# Patient Record
Sex: Female | Born: 1950 | Race: White | Hispanic: No | Marital: Married | Smoking: Never smoker
Health system: Southern US, Community
[De-identification: ages and names within clinical notes are randomized; demographics above are authoritative.]

## PROBLEM LIST (undated history)

## (undated) DIAGNOSIS — M810 Age-related osteoporosis without current pathological fracture: Secondary | ICD-10-CM

## (undated) DIAGNOSIS — F32A Depression, unspecified: Secondary | ICD-10-CM

## (undated) HISTORY — PX: MOHS SURGERY: SUR867

## (undated) HISTORY — DX: Age-related osteoporosis without current pathological fracture: M81.0

## (undated) HISTORY — PX: TUBAL LIGATION: SHX77

## (undated) HISTORY — PX: CERVICAL DISCECTOMY: SHX98

---

## 1999-08-20 ENCOUNTER — Encounter: Payer: Self-pay | Admitting: Family Medicine

## 1999-08-20 ENCOUNTER — Ambulatory Visit (HOSPITAL_COMMUNITY): Admission: RE | Admit: 1999-08-20 | Discharge: 1999-08-20 | Payer: Self-pay | Admitting: Family Medicine

## 2000-02-19 ENCOUNTER — Other Ambulatory Visit: Admission: RE | Admit: 2000-02-19 | Discharge: 2000-02-19 | Payer: Self-pay | Admitting: Obstetrics & Gynecology

## 2001-07-12 ENCOUNTER — Ambulatory Visit (HOSPITAL_COMMUNITY): Admission: RE | Admit: 2001-07-12 | Discharge: 2001-07-12 | Payer: Self-pay | Admitting: Family Medicine

## 2001-07-12 ENCOUNTER — Encounter: Payer: Self-pay | Admitting: Family Medicine

## 2001-08-02 DIAGNOSIS — M4322 Fusion of spine, cervical region: Secondary | ICD-10-CM

## 2001-08-02 HISTORY — DX: Fusion of spine, cervical region: M43.22

## 2001-12-18 ENCOUNTER — Ambulatory Visit (HOSPITAL_COMMUNITY): Admission: RE | Admit: 2001-12-18 | Discharge: 2001-12-18 | Payer: Self-pay | Admitting: Family Medicine

## 2001-12-18 ENCOUNTER — Other Ambulatory Visit: Admission: RE | Admit: 2001-12-18 | Discharge: 2001-12-18 | Payer: Self-pay | Admitting: Obstetrics & Gynecology

## 2001-12-18 ENCOUNTER — Encounter: Payer: Self-pay | Admitting: Family Medicine

## 2002-04-09 ENCOUNTER — Encounter: Payer: Self-pay | Admitting: Gastroenterology

## 2002-04-09 ENCOUNTER — Ambulatory Visit (HOSPITAL_COMMUNITY): Admission: RE | Admit: 2002-04-09 | Discharge: 2002-04-09 | Payer: Self-pay | Admitting: Gastroenterology

## 2002-04-13 ENCOUNTER — Ambulatory Visit (HOSPITAL_COMMUNITY): Admission: RE | Admit: 2002-04-13 | Discharge: 2002-04-13 | Payer: Self-pay | Admitting: Gastroenterology

## 2002-04-13 ENCOUNTER — Encounter: Payer: Self-pay | Admitting: Gastroenterology

## 2002-06-20 ENCOUNTER — Observation Stay (HOSPITAL_COMMUNITY): Admission: RE | Admit: 2002-06-20 | Discharge: 2002-06-21 | Payer: Self-pay | Admitting: Neurosurgery

## 2002-10-02 ENCOUNTER — Encounter: Admission: RE | Admit: 2002-10-02 | Discharge: 2002-12-31 | Payer: Self-pay | Admitting: Neurosurgery

## 2003-07-08 ENCOUNTER — Other Ambulatory Visit: Admission: RE | Admit: 2003-07-08 | Discharge: 2003-07-08 | Payer: Self-pay | Admitting: Obstetrics and Gynecology

## 2003-07-12 ENCOUNTER — Ambulatory Visit (HOSPITAL_COMMUNITY): Admission: RE | Admit: 2003-07-12 | Discharge: 2003-07-12 | Payer: Self-pay | Admitting: Obstetrics and Gynecology

## 2005-12-16 ENCOUNTER — Encounter: Admission: RE | Admit: 2005-12-16 | Discharge: 2005-12-16 | Payer: Self-pay | Admitting: Family Medicine

## 2006-02-15 ENCOUNTER — Other Ambulatory Visit: Admission: RE | Admit: 2006-02-15 | Discharge: 2006-02-15 | Payer: Self-pay | Admitting: Family Medicine

## 2006-03-02 ENCOUNTER — Ambulatory Visit: Payer: Self-pay | Admitting: Gastroenterology

## 2006-03-24 ENCOUNTER — Ambulatory Visit: Payer: Self-pay | Admitting: Gastroenterology

## 2006-03-24 ENCOUNTER — Encounter (INDEPENDENT_AMBULATORY_CARE_PROVIDER_SITE_OTHER): Payer: Self-pay | Admitting: Gastroenterology

## 2007-04-10 ENCOUNTER — Encounter: Admission: RE | Admit: 2007-04-10 | Discharge: 2007-04-10 | Payer: Self-pay | Admitting: Family Medicine

## 2008-08-11 ENCOUNTER — Ambulatory Visit: Payer: Self-pay | Admitting: Radiology

## 2008-08-11 ENCOUNTER — Emergency Department (HOSPITAL_BASED_OUTPATIENT_CLINIC_OR_DEPARTMENT_OTHER): Admission: EM | Admit: 2008-08-11 | Discharge: 2008-08-11 | Payer: Self-pay | Admitting: Emergency Medicine

## 2009-05-07 ENCOUNTER — Encounter: Admission: RE | Admit: 2009-05-07 | Discharge: 2009-05-07 | Payer: Self-pay | Admitting: Family Medicine

## 2010-12-18 NOTE — Assessment & Plan Note (Signed)
[facility]                           GASTROENTEROLOGY OFFICE NOTE   NAME:FLOYDKasidi, Shanker                        MRN:          [id number]  DATE:03/02/2006                            DOB:          [birth year]    REFERRING PHYSICIAN:  Holley Bouche, M.D.   PROBLEM:  Acid reflux.   HISTORY:  Kaliegh is a pleasant 60 year old white female known to Dr. Terrial Rhodes, who was last seen in 2003.  She had undergone upper endoscopy at  that time for complaints of dysphagia and dyspepsia, was found to have a  small hiatal hernia and evidence for spasm but no definite stricture.  She  was dilated with 54 Maloney and also found to have duodenitis.  She was  treated with PPI and did well.  The patient says she has not been on any  medicine over the past couple of years but then about 3 months ago started  having daily symptoms again, especially in the evening after eating dinner.  Since then, she has had persistent problems with daily heartburn and  indigestion and complains of a full feeling in her throat.  She is not  having any odynophagia but does have some dysphagia, especially with meats  and says she has been avoiding certain foods. She does get a sensation of  food hanging in her esophagus at times.  Her appetite has been fine.  Her  weight has been stable.  She does complain of some discomfort fairly  constantly in the epigastrium as well which does not necessarily seem to be  affected by eating.   The patient was seen by Dr. Tiburcio Pea about 2 weeks ago and started on Aciphex  which she has been taking once a day but says this really has not been very  effective.  Prior to that, she was taking over-the-counter Prilosec without  any benefit. She denies any aspirin or NSAID use.   The patient has not had any prior screening colonoscopy and has no family  history of colon cancer, has no current symptomatology, says her bowel  movements have been fairly  regular. She does have some problems with gas and  bloating, has not noted any melena or hematochezia.   CURRENT MEDICATIONS:  1.  Aciphex 20 mg q.a.m.  2.  Zoloft 100 b.i.d.   ALLERGIES:  No known drug allergies.   PAST MEDICAL HISTORY:  Pertinent for a tubal ligation and anterior cervical  fusion which was done in 2005.   FAMILY HISTORY:  Both parents with lung cancer.  No family history of colon  cancer.   SOCIAL HISTORY:  The patient is married.  She is employed as an Airline pilot.  She has 3 children.  She is a nonsmoker, nondrinker.   REVIEW OF SYSTEMS:  Pertinent only for night sweats.   PHYSICAL EXAMINATION:  GENERAL: Well-developed white female in no acute  distress.  VITAL SIGNS: Height is 5 feet 4 inches.  Weight is 116.  Blood pressure  92/60, pulse 68.  HEENT:  Atraumatic and normocephalic.  EOMI.  PERRLA.  Sclerae anicteric.  CARDIOVASCULAR:  Regular rate and rhythm with S1 and S2.  PULMONARY: Clear to auscultation and percussion.  ABDOMEN: Soft and nontender.  There is no palpable mass or  hepatosplenomegaly.  Bowel sounds are active.  RECTAL:  Exam is not done at this time.   IMPRESSION:  90.  A 60 year old white female with 2 to 45-month history of daily heartburn      and indigestion with intermittent solid food dysphagia.  I suspect she      does have recurrent esophagitis, possibly gastritis as well.  Dysphagia      may be due to spasm, though she could have developed a stricture.  She      did not have a stricture at the time of her empiric dilation in 2003.  2.  Colon neoplasia screening.  The patient needs colonoscopy.   PLAN:  1.  Schedule EGD with dilation.  2.  Schedule colonoscopy.  3.  Trial of Nexium 40 mg p.o. b.i.d. to be taken before breakfast and      before dinner.  4.  Have discussed a strict antireflux regimen including n.p.o. for 3 hours      prior to bed, elevation of the head of the bed, etc.  5.  Carafate slurry 1 g 4 times a day  between meals and then at bedtime x2      weeks and then p.r.n. thereafter.                                   Mike Gip, PA-C                                Ulyess Mort, MD   AE/MedQ  DD:  03/02/2006  DT:  03/02/2006  Job #:  [id number]   cc:   Holley Bouche, MD

## 2010-12-18 NOTE — Op Note (Signed)
Rebecca Mcfarland, Rebecca Mcfarland                           ACCOUNT NO.:  [id number]   MEDICAL RECORD NO.:  [id number]                   PATIENT TYPE:  INP   LOCATION:  3012                                 FACILITY:  [facility]   PHYSICIAN:  Cristi Loron, M.D.            DATE OF BIRTH:  1951/04/19   DATE OF PROCEDURE:  06/20/2002  DATE OF DISCHARGE:                                 OPERATIVE REPORT   INDICATIONS FOR PROCEDURE:  The patient is a 60 year old white female who  has suffered from neck and left shoulder arm pain for years.  She failed  medial management and was worked up with a cervical MRI which demonstrated  C5-6 degenerative disease, spondylosis, and stenosis.  I discussed the  various treatment options with the patient and her husband including  surgery.  They weighed the risks, benefits, and alternatives of surgery and  decided to proceed with a C5-6 anterior cervical diskectomy with fusion and  plating.   PREOPERATIVE DIAGNOSIS:  C5-6 spondylosis, stenosis, degenerative disk  disease, cervical radiculopathy, cervicalgia.   POSTOPERATIVE DIAGNOSIS:  C5-6 spondylosis, stenosis, degenerative disk  disease, cervical radiculopathy, cervicalgia.   PROCEDURE:  C5-6 extensive anterior cervical diskectomy/decompression;  interbody iliac crest allograft arthrodesis, anterior cervical plating  (Synthes titanium plate and screws).   SURGEON:  Cristi Loron, M.D.   ASSISTANT:  Coletta Memos, M.D.   ANESTHESIA:  General endotracheal.   ESTIMATED BLOOD LOSS:  100 cc.   SPECIMENS:  None.   DRAINS:  None.   COMPLICATIONS:  None.   DESCRIPTION OF PROCEDURE:  The patient was brought to the operating room by  the anesthesia team.  General endotracheal was induced.  The patient  remained in the supine position. A roll was placed under her shoulders to  place her neck in slight extension.  The anterior cervical region was then  prepared with Betadine scrub and Betadine solution.  Sterile drapes were  applied.  I then injected the area to be incised with Marcaine with  epinephrine solution.  I used the scalpel to make a linear midline incision  in the patient's left anterior neck.  The Metzenbaum scissors were used to  divide the platysma muscle and then to dissect medial to the  sternocleidomastoid muscle, jugular vein and carotid artery. We bluntly  dissected down toward the anterior cervical spine, carefully identifying the  sac and using retractors medially.  Soft tissue was then removed from the  anterior cervical spine using Kitner swabs and a bent spinal needle was  inserted into the exposed interspace.  We obtained an intraoperative  radiograph to confirm our location.  Electrocautery was used to detach the  medial border of the longus coli muscle bilaterally at the C5-6  intervertebral disk space and then the Caspar self retaining retractor was  inserted for exposure.  We then incised the C5-6 intervertebral disk with a  15 blade scalpel and  a partial diskectomy was performed using the pituitary  forceps and the caudal curets.  The screws were placed into C5 and C6 and  the interspace was distracted and then the highspeed drill was used to  decorticate the vertebral endplates at C5-6, drilling away the remainder of  the intervertebral disk, thinning out the posterior longitudinal ligament.  The ligament was then incised with the ratchet knife and then removed with  the Kerrison punch on the vertebral endplates at C5-6 due to excess thecal  sac and then performed generous foraminotomies about the bilateral C6 nerve  roots getting good decompression bilaterally.   We now turned our attention to the fusion.  We obtained iliac crest cortical  allograft bone graft and fashioned it with the distraction wrenches 7 mm in  height and 1 cm in depth. The bone graft was then gently inserted into place  at the C5-6 interspace and tapped into place and then the  distractor screws  were removed and there was a good snug fit of the bone graft.   We now turned our attention to the anterior spinal instrumentation. We  obtained the appropriate length Synthes anterior cervical plate and laid it  along the anterior aspect of the vertebral bodies of C5 and C6, drilled two  holes at C5, through C6, tapped the holes and then secured the plate to the  vertebral bodies with two 14 mm screws at C5 and two at C6.  We then  obtained an intraoperative radiograph which demonstrated good purchase of  screws and then the screws were locked to the plate using the locking screws  at each screw. We then achieved stringent hemostasis with bipolar  electrocautery and Gelfoam.  The wound was copiously irrigated with  bacitracin solution. The solution was removed as was the Arpelar self  retaining retractor and then the esophagus was inspected for any damage and  there was none apparent.  We then reapproximated the patient's platysmal  muscle with interrupted 3-0 Vicryl suture and subcutaneous tissues with  interrupted 3-0 Vicryl suture and the skin with Steri-Strips and Benzoin.  The wound was then coated with bacitracin ointment and sterile dressing was  applied. The drapes were removed. The patient was subsequently extubated by  the anesthesia team and transported to the postanesthesia care unit in  stable condition.  All needle, sponge, and instrument count correct at the  end of the case.                                               Cristi Loron, M.D.    JDJ/MEDQ  D:  06/20/2002  T:  06/20/2002  Job:  [id number]

## 2011-05-31 ENCOUNTER — Other Ambulatory Visit: Payer: Self-pay | Admitting: Family Medicine

## 2011-05-31 ENCOUNTER — Other Ambulatory Visit (HOSPITAL_COMMUNITY)
Admission: RE | Admit: 2011-05-31 | Discharge: 2011-05-31 | Disposition: A | Discharge status: Home / Self Care | Payer: BC Managed Care – PPO | Source: Ambulatory Visit | Attending: Family Medicine | Admitting: Family Medicine

## 2011-05-31 DIAGNOSIS — Z0000 Encounter for general adult medical examination without abnormal findings: Secondary | ICD-10-CM | POA: Insufficient documentation

## 2011-05-31 DIAGNOSIS — Z1231 Encounter for screening mammogram for malignant neoplasm of breast: Secondary | ICD-10-CM

## 2011-06-04 ENCOUNTER — Ambulatory Visit
Admission: RE | Admit: 2011-06-04 | Discharge: 2011-06-04 | Disposition: A | Discharge status: Home / Self Care | Payer: BC Managed Care – PPO | Source: Ambulatory Visit | Attending: Family Medicine | Admitting: Family Medicine

## 2011-06-04 DIAGNOSIS — Z1231 Encounter for screening mammogram for malignant neoplasm of breast: Secondary | ICD-10-CM

## 2012-08-03 ENCOUNTER — Other Ambulatory Visit: Payer: Self-pay | Admitting: Family Medicine

## 2012-08-03 DIAGNOSIS — Z1231 Encounter for screening mammogram for malignant neoplasm of breast: Secondary | ICD-10-CM

## 2012-08-25 ENCOUNTER — Ambulatory Visit
Admission: RE | Admit: 2012-08-25 | Discharge: 2012-08-25 | Disposition: A | Discharge status: Home / Self Care | Payer: BC Managed Care – PPO | Source: Ambulatory Visit | Attending: Family Medicine | Admitting: Family Medicine

## 2012-08-25 DIAGNOSIS — Z1231 Encounter for screening mammogram for malignant neoplasm of breast: Secondary | ICD-10-CM

## 2013-10-19 ENCOUNTER — Other Ambulatory Visit: Payer: Self-pay

## 2013-10-19 DIAGNOSIS — Z1231 Encounter for screening mammogram for malignant neoplasm of breast: Secondary | ICD-10-CM

## 2014-06-07 ENCOUNTER — Ambulatory Visit
Admission: RE | Admit: 2014-06-07 | Discharge: 2014-06-07 | Disposition: A | Discharge status: Home / Self Care | Payer: BC Managed Care – PPO | Source: Ambulatory Visit

## 2014-06-07 DIAGNOSIS — Z1231 Encounter for screening mammogram for malignant neoplasm of breast: Secondary | ICD-10-CM

## 2014-08-15 ENCOUNTER — Other Ambulatory Visit (HOSPITAL_COMMUNITY)
Admission: RE | Admit: 2014-08-15 | Discharge: 2014-08-15 | Disposition: A | Discharge status: Home / Self Care | Payer: BLUE CROSS/BLUE SHIELD | Source: Ambulatory Visit | Attending: Family Medicine | Admitting: Family Medicine

## 2014-08-15 ENCOUNTER — Other Ambulatory Visit: Payer: Self-pay | Admitting: Family Medicine

## 2014-08-15 DIAGNOSIS — Z01419 Encounter for gynecological examination (general) (routine) without abnormal findings: Secondary | ICD-10-CM | POA: Diagnosis present

## 2014-08-16 LAB — CYTOLOGY - PAP

## 2015-07-22 ENCOUNTER — Other Ambulatory Visit: Payer: Self-pay

## 2015-07-22 DIAGNOSIS — Z1231 Encounter for screening mammogram for malignant neoplasm of breast: Secondary | ICD-10-CM

## 2015-08-20 ENCOUNTER — Ambulatory Visit
Admission: RE | Admit: 2015-08-20 | Discharge: 2015-08-20 | Disposition: A | Discharge status: Home / Self Care | Payer: BLUE CROSS/BLUE SHIELD | Source: Ambulatory Visit

## 2015-08-20 DIAGNOSIS — Z1231 Encounter for screening mammogram for malignant neoplasm of breast: Secondary | ICD-10-CM

## 2016-09-28 ENCOUNTER — Encounter: Payer: Self-pay | Admitting: Family Medicine

## 2016-10-18 ENCOUNTER — Other Ambulatory Visit: Payer: Self-pay | Admitting: Family Medicine

## 2016-10-18 DIAGNOSIS — Z1231 Encounter for screening mammogram for malignant neoplasm of breast: Secondary | ICD-10-CM

## 2016-11-03 ENCOUNTER — Ambulatory Visit
Admission: RE | Admit: 2016-11-03 | Discharge: 2016-11-03 | Disposition: A | Discharge status: Home / Self Care | Payer: 59 | Source: Ambulatory Visit | Attending: Family Medicine | Admitting: Family Medicine

## 2016-11-03 DIAGNOSIS — Z1231 Encounter for screening mammogram for malignant neoplasm of breast: Secondary | ICD-10-CM

## 2017-09-01 ENCOUNTER — Other Ambulatory Visit (HOSPITAL_COMMUNITY)
Admission: RE | Admit: 2017-09-01 | Discharge: 2017-09-01 | Disposition: A | Discharge status: Home / Self Care | Payer: 59 | Source: Ambulatory Visit | Attending: Family Medicine | Admitting: Family Medicine

## 2017-09-01 ENCOUNTER — Other Ambulatory Visit: Payer: Self-pay | Admitting: Family Medicine

## 2017-09-01 DIAGNOSIS — Z01419 Encounter for gynecological examination (general) (routine) without abnormal findings: Secondary | ICD-10-CM | POA: Insufficient documentation

## 2017-09-02 LAB — CYTOLOGY - PAP: Diagnosis: NEGATIVE

## 2017-12-13 ENCOUNTER — Other Ambulatory Visit: Payer: Self-pay | Admitting: Family Medicine

## 2017-12-13 DIAGNOSIS — Z1231 Encounter for screening mammogram for malignant neoplasm of breast: Secondary | ICD-10-CM

## 2017-12-16 ENCOUNTER — Ambulatory Visit
Admission: RE | Admit: 2017-12-16 | Discharge: 2017-12-16 | Disposition: A | Discharge status: Home / Self Care | Payer: 59 | Source: Ambulatory Visit | Attending: Family Medicine | Admitting: Family Medicine

## 2017-12-16 DIAGNOSIS — Z1231 Encounter for screening mammogram for malignant neoplasm of breast: Secondary | ICD-10-CM

## 2018-09-06 ENCOUNTER — Other Ambulatory Visit: Payer: Self-pay | Admitting: Family Medicine

## 2018-09-06 DIAGNOSIS — M810 Age-related osteoporosis without current pathological fracture: Secondary | ICD-10-CM

## 2018-12-12 ENCOUNTER — Other Ambulatory Visit: Payer: Self-pay | Admitting: Family Medicine

## 2018-12-12 DIAGNOSIS — Z1231 Encounter for screening mammogram for malignant neoplasm of breast: Secondary | ICD-10-CM

## 2019-03-02 ENCOUNTER — Ambulatory Visit
Admission: RE | Admit: 2019-03-02 | Discharge: 2019-03-02 | Disposition: A | Discharge status: Home / Self Care | Payer: 59 | Source: Ambulatory Visit | Attending: Family Medicine | Admitting: Family Medicine

## 2019-03-02 ENCOUNTER — Other Ambulatory Visit: Payer: Self-pay

## 2019-03-02 DIAGNOSIS — M810 Age-related osteoporosis without current pathological fracture: Secondary | ICD-10-CM

## 2019-03-02 DIAGNOSIS — Z1231 Encounter for screening mammogram for malignant neoplasm of breast: Secondary | ICD-10-CM

## 2019-08-17 ENCOUNTER — Ambulatory Visit: Payer: 59 | Attending: Internal Medicine

## 2019-08-17 DIAGNOSIS — Z20822 Contact with and (suspected) exposure to covid-19: Secondary | ICD-10-CM

## 2019-08-18 LAB — NOVEL CORONAVIRUS, NAA: SARS-CoV-2, NAA: NOT DETECTED

## 2020-04-02 ENCOUNTER — Other Ambulatory Visit: Payer: Self-pay | Admitting: Family Medicine

## 2020-04-02 DIAGNOSIS — Z1231 Encounter for screening mammogram for malignant neoplasm of breast: Secondary | ICD-10-CM

## 2020-04-08 ENCOUNTER — Other Ambulatory Visit: Payer: Self-pay

## 2020-04-08 ENCOUNTER — Ambulatory Visit
Admission: RE | Admit: 2020-04-08 | Discharge: 2020-04-08 | Disposition: A | Discharge status: Home / Self Care | Payer: Medicare Other | Source: Ambulatory Visit

## 2020-04-08 DIAGNOSIS — Z1231 Encounter for screening mammogram for malignant neoplasm of breast: Secondary | ICD-10-CM

## 2020-04-11 ENCOUNTER — Other Ambulatory Visit: Payer: Self-pay | Admitting: Family Medicine

## 2020-04-11 DIAGNOSIS — R928 Other abnormal and inconclusive findings on diagnostic imaging of breast: Secondary | ICD-10-CM

## 2020-04-28 ENCOUNTER — Ambulatory Visit: Payer: Medicare Other

## 2020-04-28 ENCOUNTER — Ambulatory Visit
Admission: RE | Admit: 2020-04-28 | Discharge: 2020-04-28 | Disposition: A | Discharge status: Home / Self Care | Payer: Medicare Other | Source: Ambulatory Visit | Attending: Family Medicine | Admitting: Family Medicine

## 2020-04-28 ENCOUNTER — Other Ambulatory Visit: Payer: Self-pay

## 2020-04-28 DIAGNOSIS — R928 Other abnormal and inconclusive findings on diagnostic imaging of breast: Secondary | ICD-10-CM

## 2020-08-12 DIAGNOSIS — M25562 Pain in left knee: Secondary | ICD-10-CM | POA: Diagnosis not present

## 2020-08-21 DIAGNOSIS — M25562 Pain in left knee: Secondary | ICD-10-CM | POA: Diagnosis not present

## 2020-08-23 DIAGNOSIS — M25562 Pain in left knee: Secondary | ICD-10-CM | POA: Diagnosis not present

## 2020-08-28 DIAGNOSIS — M25562 Pain in left knee: Secondary | ICD-10-CM | POA: Diagnosis not present

## 2020-09-16 DIAGNOSIS — E7800 Pure hypercholesterolemia, unspecified: Secondary | ICD-10-CM | POA: Diagnosis not present

## 2020-09-16 DIAGNOSIS — Z0000 Encounter for general adult medical examination without abnormal findings: Secondary | ICD-10-CM | POA: Diagnosis not present

## 2020-09-22 ENCOUNTER — Other Ambulatory Visit (HOSPITAL_COMMUNITY)
Admission: RE | Admit: 2020-09-22 | Discharge: 2020-09-22 | Disposition: A | Discharge status: Home / Self Care | Payer: Medicare Other | Source: Ambulatory Visit | Attending: Orthopaedic Surgery | Admitting: Orthopaedic Surgery

## 2020-09-22 ENCOUNTER — Encounter (HOSPITAL_BASED_OUTPATIENT_CLINIC_OR_DEPARTMENT_OTHER): Payer: Self-pay | Admitting: Orthopaedic Surgery

## 2020-09-22 ENCOUNTER — Other Ambulatory Visit: Payer: Self-pay

## 2020-09-22 DIAGNOSIS — Z20822 Contact with and (suspected) exposure to covid-19: Secondary | ICD-10-CM | POA: Insufficient documentation

## 2020-09-22 DIAGNOSIS — Z01812 Encounter for preprocedural laboratory examination: Secondary | ICD-10-CM | POA: Diagnosis not present

## 2020-09-22 LAB — SARS CORONAVIRUS 2 (TAT 6-24 HRS): SARS Coronavirus 2: NEGATIVE

## 2020-09-22 NOTE — Progress Notes (Signed)
I left a voicemail message with Sherri at [facility] office making her aware that this pt has been called and left multiple voice messages on the pts listed phone number and her husbands cell phone number with no success.

## 2020-09-23 NOTE — H&P (Signed)
PREOPERATIVE H&P  Chief Complaint: LEFT KNEE MEDICAL MENISCUS TEAR  HPI: Rebecca Mcfarland is a 70 y.o. female who is scheduled for, Procedure(s): KNEE ARTHROSCOPY WITH MEDIAL MENISECTOMY KNEE ARTHROSCOPY WITH MEDIAL MENISCAL REPAIR.   The patient is a healthy 70 year old who was seen in urgent care for medial based knee pain in an active patient who appears much younger than her stated age.  She has done some ice and self-directed therapy exercises and has made improvement, but she is still not able to run.  She has pain and catching in the medial aspect of her knee.    Her symptoms are rated as moderate to severe, and have been worsening.  This is significantly impairing activities of daily living.    Please see clinic note for further details on this patient's care.    She has elected for surgical management.   Past Medical History:  Diagnosis Date  . Cervical vertebral fusion 2003  . Depression    Past Surgical History:  Procedure Laterality Date  . MOHS SURGERY  2021,2011   Social History   Socioeconomic History  . Marital status: Married    Spouse name: Not on file  . Number of children: Not on file  . Years of education: Not on file  . Highest education level: Not on file  Occupational History  . Not on file  Tobacco Use  . Smoking status: Never Smoker  . Smokeless tobacco: Never Used  Substance and Sexual Activity  . Alcohol use: Not on file    Comment: occas  . Drug use: Not on file  . Sexual activity: Not on file  Other Topics Concern  . Not on file  Social History Narrative  . Not on file   Social Determinants of Health   Financial Resource Strain: Not on file  Food Insecurity: Not on file  Transportation Needs: Not on file  Physical Activity: Not on file  Stress: Not on file  Social Connections: Not on file   Family History  Problem Relation Age of Onset  . Breast cancer Neg Hx    No Known Allergies Prior to Admission medications    Medication Sig Start Date End Date Taking? Authorizing Provider  buPROPion (WELLBUTRIN XL) 300 MG 24 hr tablet Take 300 mg by mouth daily.   Yes [provider]  sertraline (ZOLOFT) 100 MG tablet Take 200 mg by mouth daily.   Yes [provider]    ROS: All other systems have been reviewed and were otherwise negative with the exception of those mentioned in the HPI and as above.  Physical Exam: General: Alert, no acute distress Cardiovascular: No pedal edema Respiratory: No cyanosis, no use of accessory musculature GI: No organomegaly, abdomen is soft and non-tender Skin: No lesions in the area of chief complaint Neurologic: Sensation intact distally Psychiatric: Patient is competent for consent with normal mood and affect Lymphatic: No axillary or cervical lymphadenopathy  MUSCULOSKELETAL:  Left knee: Positive McMurray's.  Tender to palpation at the medial joint line.  Ligamentous exam is normal.  No effusion.    Imaging: MRI demonstrates complete radial tear of the posterior horn of the root of the meniscus.  She has some mild degenerative changes, but nothing significant.    Assessment: LEFT KNEE MEDICAL MENISCUS TEAR  Plan: Plan for Procedure(s): KNEE ARTHROSCOPY WITH MEDIAL MENISECTOMY KNEE ARTHROSCOPY WITH MEDIAL MENISCAL REPAIR  The risks benefits and alternatives were discussed with the patient including but not limited to the  risks of nonoperative treatment, versus surgical intervention including infection, bleeding, nerve injury,  blood clots, cardiopulmonary complications, morbidity, mortality, among others, and they were willing to proceed.   The patient acknowledged the explanation, agreed to proceed with the plan and consent was signed.   Operative Plan: Left knee scope with meniscal root repair versus partial meniscectomy Discharge Medications: Tylenol, Celebrex, Oxycodone, Zofran DVT Prophylaxis: Aspirin Physical Therapy: +/- Special  Discharge needs: +/-   Ethelda Chick, PA-C  09/23/2020 6:57 AM

## 2020-09-25 ENCOUNTER — Other Ambulatory Visit: Payer: Self-pay

## 2020-09-25 ENCOUNTER — Encounter (HOSPITAL_BASED_OUTPATIENT_CLINIC_OR_DEPARTMENT_OTHER): Payer: Self-pay | Admitting: Orthopaedic Surgery

## 2020-09-25 ENCOUNTER — Ambulatory Visit (HOSPITAL_BASED_OUTPATIENT_CLINIC_OR_DEPARTMENT_OTHER): Payer: Medicare Other | Admitting: Anesthesiology

## 2020-09-25 ENCOUNTER — Ambulatory Visit (HOSPITAL_BASED_OUTPATIENT_CLINIC_OR_DEPARTMENT_OTHER)
Admission: RE | Admit: 2020-09-25 | Discharge: 2020-09-25 | Disposition: A | Discharge status: Home / Self Care | Payer: Medicare Other | Attending: Orthopaedic Surgery | Admitting: Orthopaedic Surgery

## 2020-09-25 ENCOUNTER — Encounter (HOSPITAL_BASED_OUTPATIENT_CLINIC_OR_DEPARTMENT_OTHER)
Admission: RE | Disposition: A | Discharge status: Home / Self Care | Payer: Self-pay | Source: Home / Self Care | Attending: Orthopaedic Surgery

## 2020-09-25 DIAGNOSIS — X58XXXA Exposure to other specified factors, initial encounter: Secondary | ICD-10-CM | POA: Diagnosis not present

## 2020-09-25 DIAGNOSIS — S83242A Other tear of medial meniscus, current injury, left knee, initial encounter: Secondary | ICD-10-CM | POA: Insufficient documentation

## 2020-09-25 DIAGNOSIS — Z79899 Other long term (current) drug therapy: Secondary | ICD-10-CM | POA: Diagnosis not present

## 2020-09-25 DIAGNOSIS — Z981 Arthrodesis status: Secondary | ICD-10-CM | POA: Diagnosis not present

## 2020-09-25 DIAGNOSIS — Y939 Activity, unspecified: Secondary | ICD-10-CM | POA: Insufficient documentation

## 2020-09-25 DIAGNOSIS — Q754 Mandibulofacial dysostosis: Secondary | ICD-10-CM | POA: Diagnosis present

## 2020-09-25 DIAGNOSIS — F32A Depression, unspecified: Secondary | ICD-10-CM | POA: Diagnosis not present

## 2020-09-25 HISTORY — PX: KNEE ARTHROSCOPY WITH MENISCAL REPAIR: SHX5653

## 2020-09-25 HISTORY — DX: Depression, unspecified: F32.A

## 2020-09-25 SURGERY — ARTHROSCOPY, KNEE, WITH MENISCUS REPAIR
Anesthesia: General | Site: Knee | Laterality: Left

## 2020-09-25 MED ORDER — FENTANYL CITRATE (PF) 100 MCG/2ML IJ SOLN
INTRAMUSCULAR | Status: DC | PRN
  Administered 2020-09-25 (×2): 25 ug via INTRAVENOUS
  Administered 2020-09-25: 50 ug via INTRAVENOUS

## 2020-09-25 MED ORDER — FENTANYL CITRATE (PF) 100 MCG/2ML IJ SOLN
INTRAMUSCULAR | Status: AC
  Filled 2020-09-25: qty 2

## 2020-09-25 MED ORDER — MIDAZOLAM HCL 2 MG/2ML IJ SOLN
INTRAMUSCULAR | Status: AC
  Filled 2020-09-25: qty 2

## 2020-09-25 MED ORDER — EPHEDRINE SULFATE 50 MG/ML IJ SOLN
INTRAMUSCULAR | Status: DC | PRN
  Administered 2020-09-25: 10 mg via INTRAVENOUS

## 2020-09-25 MED ORDER — LACTATED RINGERS IV SOLN: INTRAVENOUS | Status: DC

## 2020-09-25 MED ORDER — ONDANSETRON HCL 4 MG/2ML IJ SOLN
INTRAMUSCULAR | Status: AC
  Filled 2020-09-25: qty 2

## 2020-09-25 MED ORDER — PROPOFOL 10 MG/ML IV BOLUS
INTRAVENOUS | Status: AC
  Filled 2020-09-25: qty 20

## 2020-09-25 MED ORDER — OXYCODONE HCL 5 MG PO TABS: ORAL_TABLET | ORAL | 0 refills | Status: AC

## 2020-09-25 MED ORDER — OMEPRAZOLE 20 MG PO CPDR: 20.0000 mg | DELAYED_RELEASE_CAPSULE | Freq: Every day | ORAL | 0 refills | Status: DC

## 2020-09-25 MED ORDER — MIDAZOLAM HCL 5 MG/5ML IJ SOLN
INTRAMUSCULAR | Status: DC | PRN
  Administered 2020-09-25: 2 mg via INTRAVENOUS

## 2020-09-25 MED ORDER — CEFAZOLIN SODIUM-DEXTROSE 2-4 GM/100ML-% IV SOLN
INTRAVENOUS | Status: AC
  Filled 2020-09-25: qty 100

## 2020-09-25 MED ORDER — DEXAMETHASONE SODIUM PHOSPHATE 4 MG/ML IJ SOLN
INTRAMUSCULAR | Status: DC | PRN
  Administered 2020-09-25: 5 mg via INTRAVENOUS

## 2020-09-25 MED ORDER — FENTANYL CITRATE (PF) 100 MCG/2ML IJ SOLN
25.0000 ug | INTRAMUSCULAR | Status: DC | PRN
Start: 2020-09-25 — End: 2020-09-25
  Administered 2020-09-25: 25 ug via INTRAVENOUS
  Administered 2020-09-25 (×2): 50 ug via INTRAVENOUS
  Administered 2020-09-25: 25 ug via INTRAVENOUS

## 2020-09-25 MED ORDER — BUPIVACAINE HCL (PF) 0.25 % IJ SOLN
INTRAMUSCULAR | Status: DC | PRN
  Administered 2020-09-25: 20 mL

## 2020-09-25 MED ORDER — ACETAMINOPHEN 500 MG PO TABS: 1000.0000 mg | ORAL_TABLET | Freq: Three times a day (TID) | ORAL | 0 refills | Status: AC

## 2020-09-25 MED ORDER — ONDANSETRON HCL 4 MG/2ML IJ SOLN
INTRAMUSCULAR | Status: DC | PRN
  Administered 2020-09-25: 4 mg via INTRAVENOUS

## 2020-09-25 MED ORDER — LIDOCAINE 2% (20 MG/ML) 5 ML SYRINGE
INTRAMUSCULAR | Status: AC
  Filled 2020-09-25: qty 5

## 2020-09-25 MED ORDER — DEXAMETHASONE SODIUM PHOSPHATE 10 MG/ML IJ SOLN
INTRAMUSCULAR | Status: AC
  Filled 2020-09-25: qty 1

## 2020-09-25 MED ORDER — CELECOXIB 100 MG PO CAPS: 100.0000 mg | ORAL_CAPSULE | Freq: Two times a day (BID) | ORAL | 0 refills | Status: AC

## 2020-09-25 MED ORDER — ASPIRIN 81 MG PO CHEW: 81.0000 mg | CHEWABLE_TABLET | Freq: Two times a day (BID) | ORAL | 0 refills | Status: AC

## 2020-09-25 MED ORDER — EPHEDRINE 5 MG/ML INJ
INTRAVENOUS | Status: AC
  Filled 2020-09-25: qty 10

## 2020-09-25 MED ORDER — CEFAZOLIN SODIUM-DEXTROSE 2-4 GM/100ML-% IV SOLN
2.0000 g | INTRAVENOUS | Status: AC
  Administered 2020-09-25: 2 g via INTRAVENOUS

## 2020-09-25 MED ORDER — SODIUM CHLORIDE 0.9 % IR SOLN
Status: DC | PRN
  Administered 2020-09-25: 2000 mL

## 2020-09-25 MED ORDER — PROPOFOL 10 MG/ML IV BOLUS
INTRAVENOUS | Status: DC | PRN
  Administered 2020-09-25: 160 mg via INTRAVENOUS

## 2020-09-25 MED ORDER — ONDANSETRON HCL 4 MG PO TABS: 4.0000 mg | ORAL_TABLET | Freq: Three times a day (TID) | ORAL | 1 refills | Status: AC | PRN

## 2020-09-25 SURGICAL SUPPLY — 50 items
APL PRP STRL LF DISP 70% ISPRP (MISCELLANEOUS) ×1
BANDAGE ESMARK 6X9 LF (GAUZE/BANDAGES/DRESSINGS) IMPLANT
BLADE CLIPPER SURG (BLADE) IMPLANT
BLADE SHAVER BONE 5.0X13 (MISCELLANEOUS) IMPLANT
BNDG CMPR 9X6 STRL LF SNTH (GAUZE/BANDAGES/DRESSINGS)
BNDG ELASTIC 6X5.8 VLCR STR LF (GAUZE/BANDAGES/DRESSINGS) ×2 IMPLANT
BNDG ESMARK 6X9 LF (GAUZE/BANDAGES/DRESSINGS)
BURR OVAL 8 FLU 4.0X13 (MISCELLANEOUS) IMPLANT
CHLORAPREP W/TINT 26 (MISCELLANEOUS) ×2 IMPLANT
CLSR STERI-STRIP ANTIMIC 1/2X4 (GAUZE/BANDAGES/DRESSINGS) ×2 IMPLANT
CUFF TOURN SGL QUICK 34 (TOURNIQUET CUFF) ×2
CUFF TRNQT CYL 34X4.125X (TOURNIQUET CUFF) ×1 IMPLANT
CUTTER TENSIONER SUT 2-0 0 FBW (INSTRUMENTS) IMPLANT
DISSECTOR 3.5MM X 13CM CVD (MISCELLANEOUS) IMPLANT
DISSECTOR 4.0MMX13CM CVD (MISCELLANEOUS) ×2 IMPLANT
DRAPE ARTHROSCOPY W/POUCH 90 (DRAPES) ×2 IMPLANT
DRAPE IMP U-DRAPE 54X76 (DRAPES) ×2 IMPLANT
DRAPE U-SHAPE 47X51 STRL (DRAPES) ×2 IMPLANT
FIBERSTICK 2 (SUTURE) IMPLANT
GAUZE SPONGE 4X4 12PLY STRL (GAUZE/BANDAGES/DRESSINGS) ×2 IMPLANT
GLOVE SRG 8 PF TXTR STRL LF DI (GLOVE) ×1 IMPLANT
GLOVE SURG ENC MOIS LTX SZ6.5 (GLOVE) ×2 IMPLANT
GLOVE SURG LTX SZ8 (GLOVE) ×4 IMPLANT
GLOVE SURG UNDER POLY LF SZ6.5 (GLOVE) ×2 IMPLANT
GLOVE SURG UNDER POLY LF SZ8 (GLOVE) ×2
GOWN STRL REUS W/ TWL LRG LVL3 (GOWN DISPOSABLE) ×2 IMPLANT
GOWN STRL REUS W/TWL LRG LVL3 (GOWN DISPOSABLE) ×6
GOWN STRL REUS W/TWL XL LVL3 (GOWN DISPOSABLE) ×2 IMPLANT
IMMOBILIZER KNEE 22 UNIV (SOFTGOODS) ×1 IMPLANT
KIT ROOT REPAIR MEINISCAL PEEK (Anchor) IMPLANT
KIT TURNOVER KIT B (KITS) ×2 IMPLANT
MANIFOLD NEPTUNE II (INSTRUMENTS) IMPLANT
MEINISCAL ROOT REPAIR KIT PEEK (Anchor) ×2 IMPLANT
NDL SAFETY ECLIPSE 18X1.5 (NEEDLE) ×1 IMPLANT
NEEDLE HYPO 18GX1.5 SHARP (NEEDLE) ×2
NS IRRIG 1000ML POUR BTL (IV SOLUTION) IMPLANT
PACK ARTHROSCOPY DSU (CUSTOM PROCEDURE TRAY) ×2 IMPLANT
PAD COLD SHLDR UNI WRAP-ON (PAD) ×2
PAD COLD UNI WRAP-ON (PAD) IMPLANT
PADDING CAST COTTON 6X4 STRL (CAST SUPPLIES) IMPLANT
PORT APPOLLO RF 90DEGREE MULTI (SURGICAL WAND) IMPLANT
SLEEVE SCD COMPRESS KNEE MED (MISCELLANEOUS) ×2 IMPLANT
SUT MNCRL AB 4-0 PS2 18 (SUTURE) ×2 IMPLANT
SYR 5ML LL (SYRINGE) ×2 IMPLANT
SYR 5ML LUER SLIP (SYRINGE) ×2 IMPLANT
TOWEL GREEN STERILE FF (TOWEL DISPOSABLE) ×4 IMPLANT
TUBE CONNECTING 20X1/4 (TUBING) ×2 IMPLANT
TUBE SUCTION HIGH CAP CLEAR NV (SUCTIONS) ×1 IMPLANT
TUBING ARTHROSCOPY IRRIG 16FT (MISCELLANEOUS) ×2 IMPLANT
WATER STERILE IRR 1000ML POUR (IV SOLUTION) ×2 IMPLANT

## 2020-09-25 NOTE — Anesthesia Postprocedure Evaluation (Signed)
Anesthesia Post Note  Patient: Rebecca Mcfarland  Procedure(s) Performed: KNEE ARTHROSCOPY WITH MEDIAL MENISCAL ROOT REPAIR (Left Knee)     Patient location during evaluation: PACU Anesthesia Type: General Level of consciousness: awake Pain management: pain level controlled Vital Signs Assessment: post-procedure vital signs reviewed and stable Respiratory status: spontaneous breathing Cardiovascular status: stable Postop Assessment: no apparent nausea or vomiting Anesthetic complications: no   No complications documented.  Last Vitals:  Vitals:   09/25/20 1330 09/25/20 1351  BP: (!) 128/92 (!) 153/95  Pulse: 90 82  Resp: 16 16  Temp:  36.7 C  SpO2: 99% 95%    Last Pain:  Vitals:   09/25/20 1424  TempSrc:   PainSc: 7                  Travontae Freiberger

## 2020-09-25 NOTE — Anesthesia Procedure Notes (Signed)
Procedure Name: LMA Insertion Date/Time: 09/25/2020 11:55 AM Performed by: Glory Buff, CRNA Pre-anesthesia Checklist: Patient identified, Emergency Drugs available, Suction available and Patient being monitored Patient Re-evaluated:Patient Re-evaluated prior to induction Oxygen Delivery Method: Circle system utilized Preoxygenation: Pre-oxygenation with 100% oxygen Induction Type: IV induction Ventilation: Mask ventilation without difficulty LMA: LMA inserted LMA Size: 4.0 Number of attempts: 1 Placement Confirmation: positive ETCO2 Tube secured with: Tape Dental Injury: Teeth and Oropharynx as per pre-operative assessment

## 2020-09-25 NOTE — Discharge Instructions (Signed)
Ophelia Charter MD, MPH Noemi Chapel, PA-C [facility] [address] [phone] (tel)   [phone] (fax)   POST-OPERATIVE INSTRUCTIONS - Meniscus Repair  **DO NOT PUT ANY WEIGHT ON YOUR OPERATIVE LEG!!**  WOUND CARE - You may remove the Operative Dressing on Post-Op Day #3 (72hrs after surgery).   - Alternatively if you would like you can leave dressing on until follow-up if within 7-8 days but keep it dry. - Leave steri-strips in place until they fall off on their own, usually 2 weeks postop. - An ACE wrap may be used to control swelling, do not wrap this too tight.  If the initial ACE wrap feels too tight you may loosen it. - There may be a small amount of fluid/bleeding leaking at the surgical site.  - This is normal; the knee is filled with fluid during the procedure and can leak for 24-48hrs after surgery.  - You may change/reinforce the bandage as needed.  - Use the Cryocuff or Ice as often as possible for the first 7 days, then as needed for pain relief. Always keep a towel, ACE wrap or other barrier between the cooling unit and your skin.  - You may shower on Post-Op Day #3. Gently pat the area dry. Do not soak the knee in water or submerge it.  - Do not go swimming in the pool or ocean until 4 weeks after surgery or when otherwise instructed.  Keep dry incisions as dry as possible.   BRACE/AMBULATION - You will be placed in a brace post-operatively.  - Wear your brace at all times until follow-up.  - You may remove for hygiene. -           Use crutches or a walker to help you ambulate -           Do NOT put any body weight on your leg   REGIONAL ANESTHESIA (NERVE BLOCKS) - The anesthesia team may have performed a nerve block for you if safe in the setting of your care.  This is a great tool used to minimize pain.  Typically the block may start wearing off overnight.  This can be a challenging period but please utilize your as needed pain  medications to try and manage this period and know it will be a brief transition as the nerve block wears completely   POST-OP MEDICATIONS - Multimodal approach to pain control - In general your pain will be controlled with a combination of substances.  Prescriptions unless otherwise discussed are electronically sent to your pharmacy.  This is a carefully made plan we use to minimize narcotic use.     - Celebrex - Anti-inflammatory medication taken on a scheduled basis - Acetaminophen - Non-narcotic pain medicine taken on a scheduled basis  - Oxycodone - This is a strong narcotic, to be used only on an "as needed" basis for pain. - Aspirin 81mg  - This medicine is used to minimize the risk of blood clots after surgery. - Omeprazole -daily medicine to protect your stomach while taking anti-inflammatories. -  Zofran - take as needed for nausea  FOLLOW-UP   Please call the office to schedule a follow-up appointment for your incision check, 7-10 days post-operatively.  IF YOU HAVE ANY QUESTIONS, PLEASE FEEL FREE TO CALL OUR OFFICE.   HELPFUL INFORMATION  - If you had a block, it will wear off between 8-24 hrs postop typically.  This is period when your pain may go from  nearly zero to the pain you would have had post-op without the block.  This is an abrupt transition but nothing dangerous is happening.  You may take an extra dose of narcotic when this happens.   Keep your leg elevated to decrease swelling, which will then in turn decrease your pain. I would elevate the foot of your bed by putting a couple of couch pillows between your mattress and box spring. I would not keep pillow directly under your ankle.  - Do not sleep with a pillow behind your knee even if it is more comfortable as this may make it harder to get your knee fully straight long term.   There will be MORE swelling on days 1-3 than there is on the day of surgery.  This also is normal. The swelling will decrease with the  anti-inflammatory medication, ice and keeping it elevated. The swelling will make it more difficult to bend your knee. As the swelling goes down your motion will become easier   You may develop swelling and bruising that extends from your knee down to your calf and perhaps even to your foot over the next week. Do not be alarmed. This too is normal, and it is due to gravity   There may be some numbness adjacent to the incision site. This may last for 6-12 months or longer in some patients and is expected.   You may return to sedentary work/school in the next couple of days when you feel up to it. You will need to keep your leg elevated as much as possible    You should wean off your narcotic medicines as soon as you are able.  Most patients will be off or using minimal narcotics before their first postop appointment.    We suggest you use the pain medication the first night prior to going to bed, in order to ease any pain when the anesthesia wears off. You should avoid taking pain medications on an empty stomach as it will make you nauseous.   Do not drink alcoholic beverages or take illicit drugs when taking pain medications.   It is against the law to drive while taking narcotics. You cannot drive if your Right leg is in brace locked in extension.   Pain medication may make you constipated.  Below are a few solutions to try in this order:  o Decrease the amount of pain medication if you aren't having pain.  o Drink lots of decaffeinated fluids.  o Drink prune juice and/or each dried prunes   o If the first 3 don't work start with additional solutions  o Take Colace - an over-the-counter stool softener  o Take Senokot - an over-the-counter laxative  o Take Miralax - a stronger over-the-counter laxative   For more information including helpful videos and documents visit our website:   [email or web address]       Post Anesthesia Home Care  Instructions  Activity: Get plenty of rest for the remainder of the day. A responsible individual must stay with you for 24 hours following the procedure.  For the next 24 hours, DO NOT: -Drive a car -Paediatric nurse -Drink alcoholic beverages -Take any medication unless instructed by your physician -Make any legal decisions or sign important papers.  Meals: Start with liquid foods such as gelatin or soup. Progress to regular foods as tolerated. Avoid greasy, spicy, heavy foods. If nausea and/or vomiting occur, drink only clear liquids until the nausea and/or vomiting subsides. Call your physician if  vomiting continues.  Special Instructions/Symptoms: Your throat may feel dry or sore from the anesthesia or the breathing tube placed in your throat during surgery. If this causes discomfort, gargle with warm salt water. The discomfort should disappear within 24 hours.  If you had a scopolamine patch placed behind your ear for the management of post- operative nausea and/or vomiting:  1. The medication in the patch is effective for 72 hours, after which it should be removed.  Wrap patch in a tissue and discard in the trash. Wash hands thoroughly with soap and water. 2. You may remove the patch earlier than 72 hours if you experience unpleasant side effects which may include dry mouth, dizziness or visual disturbances. 3. Avoid touching the patch. Wash your hands with soap and water after contact with the patch.

## 2020-09-25 NOTE — Anesthesia Preprocedure Evaluation (Addendum)
Anesthesia Evaluation  Patient identified by MRN, date of birth, ID band Patient awake    Reviewed: Allergy & Precautions, NPO status , Patient's Chart, lab work & pertinent test results  Airway Mallampati: II  TM Distance: >3 FB     Dental   Pulmonary neg pulmonary ROS,    breath sounds clear to auscultation       Cardiovascular negative cardio ROS   Rhythm:Regular Rate:Normal     Neuro/Psych PSYCHIATRIC DISORDERS negative neurological ROS     GI/Hepatic negative GI ROS, Neg liver ROS,   Endo/Other  negative endocrine ROS  Renal/GU negative Renal ROS     Musculoskeletal   Abdominal   Peds  Hematology negative hematology ROS (+)   Anesthesia Other Findings   Reproductive/Obstetrics                             Anesthesia Physical Anesthesia Plan  ASA: II  Anesthesia Plan: General   Post-op Pain Management:    Induction: Intravenous  PONV Risk Score and Plan: 3 and Ondansetron, Dexamethasone and Midazolam  Airway Management Planned: LMA  Additional Equipment:   Intra-op Plan:   Post-operative Plan: Extubation in OR  Informed Consent: I have reviewed the patients History and Physical, chart, labs and discussed the procedure including the risks, benefits and alternatives for the proposed anesthesia with the patient or authorized representative who has indicated his/her understanding and acceptance.     Dental advisory given  Plan Discussed with: CRNA and Anesthesiologist  Anesthesia Plan Comments:         Anesthesia Quick Evaluation

## 2020-09-25 NOTE — Transfer of Care (Signed)
Immediate Anesthesia Transfer of Care Note  Patient: Rebecca Mcfarland  Procedure(s) Performed: KNEE ARTHROSCOPY WITH MEDIAL MENISCAL ROOT REPAIR (Left Knee)  Patient Location: PACU  Anesthesia Type:General  Level of Consciousness: drowsy, patient cooperative and responds to stimulation  Airway & Oxygen Therapy: Patient Spontanous Breathing and Patient connected to face mask oxygen  Post-op Assessment: Report given to RN and Post -op Vital signs reviewed and stable  Post vital signs: Reviewed and stable  Last Vitals:  Vitals Value Taken Time  BP 133/94 09/25/20 1245  Temp 36.6 C 09/25/20 1245  Pulse 96 09/25/20 1245  Resp 9 09/25/20 1245  SpO2 100 % 09/25/20 1245  Vitals shown include unvalidated device data.  Last Pain:  Vitals:   09/25/20 0959  TempSrc: Oral  PainSc: 2       Patients Stated Pain Goal: 4 (00/37/94 4461)  Complications: No complications documented.

## 2020-09-25 NOTE — Interval H&P Note (Signed)
History and Physical Interval Note:  09/25/2020 11:07 AM  Rebecca Mcfarland  has presented today for surgery, with the diagnosis of [place].  The various methods of treatment have been discussed with the patient and family. After consideration of risks, benefits and other options for treatment, the patient has consented to  Procedure(s): KNEE ARTHROSCOPY WITH MEDIAL MENISECTOMY (Left) KNEE ARTHROSCOPY WITH MEDIAL MENISCAL REPAIR (Left) as a surgical intervention.  The patient's history has been reviewed, patient examined, no change in status, stable for surgery.  I have reviewed the patient's chart and labs.  Questions were answered to the patient's satisfaction.     Hiram Gash

## 2020-09-25 NOTE — Op Note (Signed)
Orthopaedic Surgery Operative Note (CSN: [id number])  Rebecca Mcfarland  05/19/1951 Date of Surgery: 09/25/2020   Diagnoses:  LEFT KNEE MEDICAL MENISCUS ROOT TEAR  Procedure: Left medial meniscal root repair   Operative Finding Exam under anesthesia: Full motion no limitation good endpoint on Lachman no ligamentous instability Suprapatellar pouch: Normal Patellofemoral Compartment: Mild grade 1 changes scattered throughout patellofemoral joint Medial Compartment: There was a small area 8 x 8 on the femoral and tibial sides centrally that would have engaged with the knee in full extension of grade 2 and 3 cartilage changes however there was no grade 4 changes.  We thought this is the early signs of overload already and in this runner we felt that preserving the joint would be beneficial.  She had a complete meniscal root tear and were able to perform an anatomic repair of the meniscal root. Lateral Compartment: Normal Intercondylar Notch: Normal  Successful completion of the planned procedure.  Though the patient is older than her typical meniscal root patient she is extraordinarily active and we felt that preserving her kinematics of the medial side of her joint may prevent the need for further arthroplasty which would be hindering to her activities that she enjoys including running.  We will follow her normal meniscal root protocol.  Post-operative plan: The patient will be touchdown weightbearing with progressive weightbearing starting at 3 weeks per normal protocol.  The patient will be discharged home.  DVT prophylaxis Aspirin 81 mg twice daily for 6 weeks.  Pain control with PRN pain medication preferring oral medicines.  Follow up plan will be scheduled in approximately 7 days for incision check and 2 view xr.  Post-Op Diagnosis: Same Surgeons:Primary: Hiram Gash, MD Assistants:Caroline McBane PA-C Location: [facility] OR ROOM 6 Anesthesia: General with local Antibiotics: Ancef 2 g  with local vancomycin powder 1 g at the surgical site Tourniquet time:  Total Tourniquet Time Documented: Thigh (Left) - 33 minutes Total: Thigh (Left) - 33 minutes  Estimated Blood Loss: Minimal Complications: None Specimens: None Implants: Implant Name Type Inv. Item Serial No. Manufacturer Lot No. LRB No. Used Action  MEINISCAL ROOT REPAIR KIT PEEK - [id number] Anchor MEINISCAL ROOT REPAIR KIT PEEK  [employer] [id number] Left 1 Implanted    Indications for Surgery:   Rebecca Mcfarland is a 70 y.o. female with medial meniscal root symptoms as well as overload and mechanical symptoms with root tear noted on MRI.  Patient is a runner and is wanting to preserve her ability to run.  Benefits and risks of operative and nonoperative management were discussed prior to surgery with patient/guardian(s) and informed consent form was completed.  Specific risks including infection, need for additional surgery, meniscal root repair failure, stiffness, continued pain and need for arthroplasty amongst others   Procedure:   The patient was identified properly. Informed consent was obtained and the surgical site was marked. The patient was taken up to suite where general anesthesia was induced. The patient was placed in the supine position with a post against the surgical leg and a nonsterile tourniquet applied. The surgical leg was then prepped and draped usual sterile fashion.  A standard surgical timeout was performed.  2 standard anterior portals were made and diagnostic arthroscopy performed. Please note the findings as noted above.  We identified the medial meniscal root we debrided any unhealthy appearing tissue.  We trephinated the MCL to allow access to the joint without damage to the cartilage.  Chondroplasty for the medial femoral condyle  as well as the tibia.  At this point we used the meniscal root repair kit from [employer] to pass with a meniscal scorpion 2-0 FiberWire sutures in a cinch fashion  to the medial meniscal root.  We used an anatomic guide and as described by Laprade placed our tunnel in the anatomic location.  We used a flip cutter set to 6 mm and drilled a 6 x 10 mm tunnel to accept our root tissue.  We then were able to shuttle our sutures using a fiber stick through the anterior medial tibia.  We then placed a 4.75 mm swivel lock anchor obtaining good purchase and reducing the meniscus into its tunnel checking appropriate reduction on arthroscopy.  We had a robust repair with a stable meniscal root.  Incisions closed with absorbable suture. The patient was awoken from general anesthesia and taken to the PACU in stable condition without complication.   Noemi Chapel, PA-C, present and scrubbed throughout the case, critical for completion in a timely fashion, and for retraction, instrumentation, closure.

## 2020-09-26 ENCOUNTER — Encounter (HOSPITAL_BASED_OUTPATIENT_CLINIC_OR_DEPARTMENT_OTHER): Payer: Self-pay | Admitting: Orthopaedic Surgery

## 2020-10-02 DIAGNOSIS — S83242D Other tear of medial meniscus, current injury, left knee, subsequent encounter: Secondary | ICD-10-CM | POA: Diagnosis not present

## 2020-10-09 DIAGNOSIS — M25562 Pain in left knee: Secondary | ICD-10-CM | POA: Diagnosis not present

## 2020-10-09 DIAGNOSIS — R262 Difficulty in walking, not elsewhere classified: Secondary | ICD-10-CM | POA: Diagnosis not present

## 2020-10-09 DIAGNOSIS — M25662 Stiffness of left knee, not elsewhere classified: Secondary | ICD-10-CM | POA: Diagnosis not present

## 2020-10-09 DIAGNOSIS — M6281 Muscle weakness (generalized): Secondary | ICD-10-CM | POA: Diagnosis not present

## 2020-10-17 DIAGNOSIS — M6281 Muscle weakness (generalized): Secondary | ICD-10-CM | POA: Diagnosis not present

## 2020-10-17 DIAGNOSIS — M25562 Pain in left knee: Secondary | ICD-10-CM | POA: Diagnosis not present

## 2020-10-17 DIAGNOSIS — M25662 Stiffness of left knee, not elsewhere classified: Secondary | ICD-10-CM | POA: Diagnosis not present

## 2020-10-17 DIAGNOSIS — R262 Difficulty in walking, not elsewhere classified: Secondary | ICD-10-CM | POA: Diagnosis not present

## 2020-10-24 DIAGNOSIS — M6281 Muscle weakness (generalized): Secondary | ICD-10-CM | POA: Diagnosis not present

## 2020-10-24 DIAGNOSIS — M25662 Stiffness of left knee, not elsewhere classified: Secondary | ICD-10-CM | POA: Diagnosis not present

## 2020-10-24 DIAGNOSIS — M25562 Pain in left knee: Secondary | ICD-10-CM | POA: Diagnosis not present

## 2020-10-24 DIAGNOSIS — R262 Difficulty in walking, not elsewhere classified: Secondary | ICD-10-CM | POA: Diagnosis not present

## 2020-10-31 DIAGNOSIS — M25662 Stiffness of left knee, not elsewhere classified: Secondary | ICD-10-CM | POA: Diagnosis not present

## 2020-10-31 DIAGNOSIS — M6281 Muscle weakness (generalized): Secondary | ICD-10-CM | POA: Diagnosis not present

## 2020-10-31 DIAGNOSIS — M25562 Pain in left knee: Secondary | ICD-10-CM | POA: Diagnosis not present

## 2020-10-31 DIAGNOSIS — R262 Difficulty in walking, not elsewhere classified: Secondary | ICD-10-CM | POA: Diagnosis not present

## 2020-11-05 DIAGNOSIS — R262 Difficulty in walking, not elsewhere classified: Secondary | ICD-10-CM | POA: Diagnosis not present

## 2020-11-05 DIAGNOSIS — M6281 Muscle weakness (generalized): Secondary | ICD-10-CM | POA: Diagnosis not present

## 2020-11-05 DIAGNOSIS — M25662 Stiffness of left knee, not elsewhere classified: Secondary | ICD-10-CM | POA: Diagnosis not present

## 2020-11-05 DIAGNOSIS — M25562 Pain in left knee: Secondary | ICD-10-CM | POA: Diagnosis not present

## 2020-11-06 DIAGNOSIS — M25562 Pain in left knee: Secondary | ICD-10-CM | POA: Diagnosis not present

## 2020-11-12 DIAGNOSIS — M25562 Pain in left knee: Secondary | ICD-10-CM | POA: Diagnosis not present

## 2020-11-12 DIAGNOSIS — M6281 Muscle weakness (generalized): Secondary | ICD-10-CM | POA: Diagnosis not present

## 2020-11-12 DIAGNOSIS — M25662 Stiffness of left knee, not elsewhere classified: Secondary | ICD-10-CM | POA: Diagnosis not present

## 2020-11-12 DIAGNOSIS — R262 Difficulty in walking, not elsewhere classified: Secondary | ICD-10-CM | POA: Diagnosis not present

## 2020-11-19 DIAGNOSIS — M6281 Muscle weakness (generalized): Secondary | ICD-10-CM | POA: Diagnosis not present

## 2020-11-19 DIAGNOSIS — R262 Difficulty in walking, not elsewhere classified: Secondary | ICD-10-CM | POA: Diagnosis not present

## 2020-11-19 DIAGNOSIS — M25562 Pain in left knee: Secondary | ICD-10-CM | POA: Diagnosis not present

## 2020-11-19 DIAGNOSIS — M25662 Stiffness of left knee, not elsewhere classified: Secondary | ICD-10-CM | POA: Diagnosis not present

## 2020-11-24 DIAGNOSIS — S83242D Other tear of medial meniscus, current injury, left knee, subsequent encounter: Secondary | ICD-10-CM | POA: Diagnosis not present

## 2020-11-24 DIAGNOSIS — R262 Difficulty in walking, not elsewhere classified: Secondary | ICD-10-CM | POA: Diagnosis not present

## 2020-11-24 DIAGNOSIS — M25662 Stiffness of left knee, not elsewhere classified: Secondary | ICD-10-CM | POA: Diagnosis not present

## 2020-11-24 DIAGNOSIS — M6281 Muscle weakness (generalized): Secondary | ICD-10-CM | POA: Diagnosis not present

## 2020-12-03 DIAGNOSIS — R262 Difficulty in walking, not elsewhere classified: Secondary | ICD-10-CM | POA: Diagnosis not present

## 2020-12-03 DIAGNOSIS — M6281 Muscle weakness (generalized): Secondary | ICD-10-CM | POA: Diagnosis not present

## 2020-12-03 DIAGNOSIS — M25662 Stiffness of left knee, not elsewhere classified: Secondary | ICD-10-CM | POA: Diagnosis not present

## 2020-12-03 DIAGNOSIS — M25562 Pain in left knee: Secondary | ICD-10-CM | POA: Diagnosis not present

## 2020-12-10 DIAGNOSIS — M25662 Stiffness of left knee, not elsewhere classified: Secondary | ICD-10-CM | POA: Diagnosis not present

## 2020-12-10 DIAGNOSIS — M25562 Pain in left knee: Secondary | ICD-10-CM | POA: Diagnosis not present

## 2020-12-10 DIAGNOSIS — M6281 Muscle weakness (generalized): Secondary | ICD-10-CM | POA: Diagnosis not present

## 2020-12-10 DIAGNOSIS — R262 Difficulty in walking, not elsewhere classified: Secondary | ICD-10-CM | POA: Diagnosis not present

## 2020-12-17 DIAGNOSIS — M25562 Pain in left knee: Secondary | ICD-10-CM | POA: Diagnosis not present

## 2020-12-17 DIAGNOSIS — M6281 Muscle weakness (generalized): Secondary | ICD-10-CM | POA: Diagnosis not present

## 2020-12-17 DIAGNOSIS — M25662 Stiffness of left knee, not elsewhere classified: Secondary | ICD-10-CM | POA: Diagnosis not present

## 2020-12-17 DIAGNOSIS — R262 Difficulty in walking, not elsewhere classified: Secondary | ICD-10-CM | POA: Diagnosis not present

## 2020-12-25 DIAGNOSIS — M25562 Pain in left knee: Secondary | ICD-10-CM | POA: Diagnosis not present

## 2020-12-25 DIAGNOSIS — M25662 Stiffness of left knee, not elsewhere classified: Secondary | ICD-10-CM | POA: Diagnosis not present

## 2020-12-25 DIAGNOSIS — R262 Difficulty in walking, not elsewhere classified: Secondary | ICD-10-CM | POA: Diagnosis not present

## 2020-12-25 DIAGNOSIS — M6281 Muscle weakness (generalized): Secondary | ICD-10-CM | POA: Diagnosis not present

## 2020-12-31 DIAGNOSIS — M6281 Muscle weakness (generalized): Secondary | ICD-10-CM | POA: Diagnosis not present

## 2020-12-31 DIAGNOSIS — M25562 Pain in left knee: Secondary | ICD-10-CM | POA: Diagnosis not present

## 2020-12-31 DIAGNOSIS — R262 Difficulty in walking, not elsewhere classified: Secondary | ICD-10-CM | POA: Diagnosis not present

## 2020-12-31 DIAGNOSIS — M25662 Stiffness of left knee, not elsewhere classified: Secondary | ICD-10-CM | POA: Diagnosis not present

## 2021-01-07 DIAGNOSIS — R262 Difficulty in walking, not elsewhere classified: Secondary | ICD-10-CM | POA: Diagnosis not present

## 2021-01-07 DIAGNOSIS — M25562 Pain in left knee: Secondary | ICD-10-CM | POA: Diagnosis not present

## 2021-01-07 DIAGNOSIS — M6281 Muscle weakness (generalized): Secondary | ICD-10-CM | POA: Diagnosis not present

## 2021-01-07 DIAGNOSIS — M25662 Stiffness of left knee, not elsewhere classified: Secondary | ICD-10-CM | POA: Diagnosis not present

## 2021-01-14 DIAGNOSIS — M25562 Pain in left knee: Secondary | ICD-10-CM | POA: Diagnosis not present

## 2021-01-14 DIAGNOSIS — M6281 Muscle weakness (generalized): Secondary | ICD-10-CM | POA: Diagnosis not present

## 2021-01-14 DIAGNOSIS — R262 Difficulty in walking, not elsewhere classified: Secondary | ICD-10-CM | POA: Diagnosis not present

## 2021-01-14 DIAGNOSIS — M25662 Stiffness of left knee, not elsewhere classified: Secondary | ICD-10-CM | POA: Diagnosis not present

## 2021-01-21 DIAGNOSIS — R262 Difficulty in walking, not elsewhere classified: Secondary | ICD-10-CM | POA: Diagnosis not present

## 2021-01-21 DIAGNOSIS — M25562 Pain in left knee: Secondary | ICD-10-CM | POA: Diagnosis not present

## 2021-01-21 DIAGNOSIS — M25662 Stiffness of left knee, not elsewhere classified: Secondary | ICD-10-CM | POA: Diagnosis not present

## 2021-01-21 DIAGNOSIS — M6281 Muscle weakness (generalized): Secondary | ICD-10-CM | POA: Diagnosis not present

## 2021-02-04 DIAGNOSIS — M25562 Pain in left knee: Secondary | ICD-10-CM | POA: Diagnosis not present

## 2021-02-04 DIAGNOSIS — M6281 Muscle weakness (generalized): Secondary | ICD-10-CM | POA: Diagnosis not present

## 2021-02-04 DIAGNOSIS — R262 Difficulty in walking, not elsewhere classified: Secondary | ICD-10-CM | POA: Diagnosis not present

## 2021-02-04 DIAGNOSIS — M25662 Stiffness of left knee, not elsewhere classified: Secondary | ICD-10-CM | POA: Diagnosis not present

## 2021-04-23 DIAGNOSIS — M9901 Segmental and somatic dysfunction of cervical region: Secondary | ICD-10-CM | POA: Diagnosis not present

## 2021-04-23 DIAGNOSIS — M50323 Other cervical disc degeneration at C6-C7 level: Secondary | ICD-10-CM | POA: Diagnosis not present

## 2021-04-23 DIAGNOSIS — M5137 Other intervertebral disc degeneration, lumbosacral region: Secondary | ICD-10-CM | POA: Diagnosis not present

## 2021-04-23 DIAGNOSIS — M9903 Segmental and somatic dysfunction of lumbar region: Secondary | ICD-10-CM | POA: Diagnosis not present

## 2021-04-27 DIAGNOSIS — M9903 Segmental and somatic dysfunction of lumbar region: Secondary | ICD-10-CM | POA: Diagnosis not present

## 2021-04-27 DIAGNOSIS — M9901 Segmental and somatic dysfunction of cervical region: Secondary | ICD-10-CM | POA: Diagnosis not present

## 2021-04-27 DIAGNOSIS — M50323 Other cervical disc degeneration at C6-C7 level: Secondary | ICD-10-CM | POA: Diagnosis not present

## 2021-04-27 DIAGNOSIS — M5137 Other intervertebral disc degeneration, lumbosacral region: Secondary | ICD-10-CM | POA: Diagnosis not present

## 2021-04-28 DIAGNOSIS — M50323 Other cervical disc degeneration at C6-C7 level: Secondary | ICD-10-CM | POA: Diagnosis not present

## 2021-04-28 DIAGNOSIS — M9901 Segmental and somatic dysfunction of cervical region: Secondary | ICD-10-CM | POA: Diagnosis not present

## 2021-04-28 DIAGNOSIS — M5137 Other intervertebral disc degeneration, lumbosacral region: Secondary | ICD-10-CM | POA: Diagnosis not present

## 2021-04-28 DIAGNOSIS — M9903 Segmental and somatic dysfunction of lumbar region: Secondary | ICD-10-CM | POA: Diagnosis not present

## 2021-04-30 DIAGNOSIS — M5137 Other intervertebral disc degeneration, lumbosacral region: Secondary | ICD-10-CM | POA: Diagnosis not present

## 2021-04-30 DIAGNOSIS — M50323 Other cervical disc degeneration at C6-C7 level: Secondary | ICD-10-CM | POA: Diagnosis not present

## 2021-04-30 DIAGNOSIS — M9901 Segmental and somatic dysfunction of cervical region: Secondary | ICD-10-CM | POA: Diagnosis not present

## 2021-04-30 DIAGNOSIS — M9903 Segmental and somatic dysfunction of lumbar region: Secondary | ICD-10-CM | POA: Diagnosis not present

## 2021-05-04 DIAGNOSIS — M50323 Other cervical disc degeneration at C6-C7 level: Secondary | ICD-10-CM | POA: Diagnosis not present

## 2021-05-04 DIAGNOSIS — M5137 Other intervertebral disc degeneration, lumbosacral region: Secondary | ICD-10-CM | POA: Diagnosis not present

## 2021-05-04 DIAGNOSIS — M9903 Segmental and somatic dysfunction of lumbar region: Secondary | ICD-10-CM | POA: Diagnosis not present

## 2021-05-04 DIAGNOSIS — M9901 Segmental and somatic dysfunction of cervical region: Secondary | ICD-10-CM | POA: Diagnosis not present

## 2021-05-05 DIAGNOSIS — M9903 Segmental and somatic dysfunction of lumbar region: Secondary | ICD-10-CM | POA: Diagnosis not present

## 2021-05-05 DIAGNOSIS — M5137 Other intervertebral disc degeneration, lumbosacral region: Secondary | ICD-10-CM | POA: Diagnosis not present

## 2021-05-05 DIAGNOSIS — M9901 Segmental and somatic dysfunction of cervical region: Secondary | ICD-10-CM | POA: Diagnosis not present

## 2021-05-05 DIAGNOSIS — M50323 Other cervical disc degeneration at C6-C7 level: Secondary | ICD-10-CM | POA: Diagnosis not present

## 2021-05-07 DIAGNOSIS — M9901 Segmental and somatic dysfunction of cervical region: Secondary | ICD-10-CM | POA: Diagnosis not present

## 2021-05-07 DIAGNOSIS — M50323 Other cervical disc degeneration at C6-C7 level: Secondary | ICD-10-CM | POA: Diagnosis not present

## 2021-05-07 DIAGNOSIS — M9903 Segmental and somatic dysfunction of lumbar region: Secondary | ICD-10-CM | POA: Diagnosis not present

## 2021-05-07 DIAGNOSIS — M5137 Other intervertebral disc degeneration, lumbosacral region: Secondary | ICD-10-CM | POA: Diagnosis not present

## 2021-05-11 DIAGNOSIS — M5137 Other intervertebral disc degeneration, lumbosacral region: Secondary | ICD-10-CM | POA: Diagnosis not present

## 2021-05-11 DIAGNOSIS — M50323 Other cervical disc degeneration at C6-C7 level: Secondary | ICD-10-CM | POA: Diagnosis not present

## 2021-05-11 DIAGNOSIS — M9903 Segmental and somatic dysfunction of lumbar region: Secondary | ICD-10-CM | POA: Diagnosis not present

## 2021-05-11 DIAGNOSIS — M9901 Segmental and somatic dysfunction of cervical region: Secondary | ICD-10-CM | POA: Diagnosis not present

## 2021-05-13 DIAGNOSIS — H5213 Myopia, bilateral: Secondary | ICD-10-CM | POA: Diagnosis not present

## 2021-05-13 DIAGNOSIS — Z961 Presence of intraocular lens: Secondary | ICD-10-CM | POA: Diagnosis not present

## 2021-05-14 DIAGNOSIS — M9901 Segmental and somatic dysfunction of cervical region: Secondary | ICD-10-CM | POA: Diagnosis not present

## 2021-05-14 DIAGNOSIS — M50323 Other cervical disc degeneration at C6-C7 level: Secondary | ICD-10-CM | POA: Diagnosis not present

## 2021-05-14 DIAGNOSIS — M5137 Other intervertebral disc degeneration, lumbosacral region: Secondary | ICD-10-CM | POA: Diagnosis not present

## 2021-05-14 DIAGNOSIS — M9903 Segmental and somatic dysfunction of lumbar region: Secondary | ICD-10-CM | POA: Diagnosis not present

## 2021-05-18 DIAGNOSIS — M5137 Other intervertebral disc degeneration, lumbosacral region: Secondary | ICD-10-CM | POA: Diagnosis not present

## 2021-05-18 DIAGNOSIS — M9901 Segmental and somatic dysfunction of cervical region: Secondary | ICD-10-CM | POA: Diagnosis not present

## 2021-05-18 DIAGNOSIS — M9903 Segmental and somatic dysfunction of lumbar region: Secondary | ICD-10-CM | POA: Diagnosis not present

## 2021-05-18 DIAGNOSIS — M50323 Other cervical disc degeneration at C6-C7 level: Secondary | ICD-10-CM | POA: Diagnosis not present

## 2021-05-19 DIAGNOSIS — M50323 Other cervical disc degeneration at C6-C7 level: Secondary | ICD-10-CM | POA: Diagnosis not present

## 2021-05-19 DIAGNOSIS — M9901 Segmental and somatic dysfunction of cervical region: Secondary | ICD-10-CM | POA: Diagnosis not present

## 2021-05-19 DIAGNOSIS — M5137 Other intervertebral disc degeneration, lumbosacral region: Secondary | ICD-10-CM | POA: Diagnosis not present

## 2021-05-19 DIAGNOSIS — M9903 Segmental and somatic dysfunction of lumbar region: Secondary | ICD-10-CM | POA: Diagnosis not present

## 2021-05-25 DIAGNOSIS — M9901 Segmental and somatic dysfunction of cervical region: Secondary | ICD-10-CM | POA: Diagnosis not present

## 2021-05-25 DIAGNOSIS — M5137 Other intervertebral disc degeneration, lumbosacral region: Secondary | ICD-10-CM | POA: Diagnosis not present

## 2021-05-25 DIAGNOSIS — M9903 Segmental and somatic dysfunction of lumbar region: Secondary | ICD-10-CM | POA: Diagnosis not present

## 2021-05-25 DIAGNOSIS — M50323 Other cervical disc degeneration at C6-C7 level: Secondary | ICD-10-CM | POA: Diagnosis not present

## 2021-05-27 DIAGNOSIS — M50323 Other cervical disc degeneration at C6-C7 level: Secondary | ICD-10-CM | POA: Diagnosis not present

## 2021-05-27 DIAGNOSIS — M9901 Segmental and somatic dysfunction of cervical region: Secondary | ICD-10-CM | POA: Diagnosis not present

## 2021-05-27 DIAGNOSIS — M5137 Other intervertebral disc degeneration, lumbosacral region: Secondary | ICD-10-CM | POA: Diagnosis not present

## 2021-05-27 DIAGNOSIS — M9903 Segmental and somatic dysfunction of lumbar region: Secondary | ICD-10-CM | POA: Diagnosis not present

## 2021-06-03 DIAGNOSIS — M5137 Other intervertebral disc degeneration, lumbosacral region: Secondary | ICD-10-CM | POA: Diagnosis not present

## 2021-06-03 DIAGNOSIS — M9901 Segmental and somatic dysfunction of cervical region: Secondary | ICD-10-CM | POA: Diagnosis not present

## 2021-06-03 DIAGNOSIS — M50323 Other cervical disc degeneration at C6-C7 level: Secondary | ICD-10-CM | POA: Diagnosis not present

## 2021-06-03 DIAGNOSIS — M9903 Segmental and somatic dysfunction of lumbar region: Secondary | ICD-10-CM | POA: Diagnosis not present

## 2021-06-10 DIAGNOSIS — M9901 Segmental and somatic dysfunction of cervical region: Secondary | ICD-10-CM | POA: Diagnosis not present

## 2021-06-10 DIAGNOSIS — M50323 Other cervical disc degeneration at C6-C7 level: Secondary | ICD-10-CM | POA: Diagnosis not present

## 2021-06-10 DIAGNOSIS — M9903 Segmental and somatic dysfunction of lumbar region: Secondary | ICD-10-CM | POA: Diagnosis not present

## 2021-06-10 DIAGNOSIS — M5137 Other intervertebral disc degeneration, lumbosacral region: Secondary | ICD-10-CM | POA: Diagnosis not present

## 2021-06-15 ENCOUNTER — Other Ambulatory Visit: Payer: Self-pay | Admitting: Family Medicine

## 2021-06-15 DIAGNOSIS — Z1231 Encounter for screening mammogram for malignant neoplasm of breast: Secondary | ICD-10-CM

## 2021-06-17 DIAGNOSIS — M9903 Segmental and somatic dysfunction of lumbar region: Secondary | ICD-10-CM | POA: Diagnosis not present

## 2021-06-17 DIAGNOSIS — M50323 Other cervical disc degeneration at C6-C7 level: Secondary | ICD-10-CM | POA: Diagnosis not present

## 2021-06-17 DIAGNOSIS — M9901 Segmental and somatic dysfunction of cervical region: Secondary | ICD-10-CM | POA: Diagnosis not present

## 2021-06-17 DIAGNOSIS — M5137 Other intervertebral disc degeneration, lumbosacral region: Secondary | ICD-10-CM | POA: Diagnosis not present

## 2021-07-16 ENCOUNTER — Ambulatory Visit
Admission: RE | Admit: 2021-07-16 | Discharge: 2021-07-16 | Disposition: A | Discharge status: Home / Self Care | Payer: Medicare Other | Source: Ambulatory Visit

## 2021-07-16 DIAGNOSIS — Z1231 Encounter for screening mammogram for malignant neoplasm of breast: Secondary | ICD-10-CM | POA: Diagnosis not present

## 2021-08-13 DIAGNOSIS — J209 Acute bronchitis, unspecified: Secondary | ICD-10-CM | POA: Diagnosis not present

## 2021-08-27 DIAGNOSIS — L821 Other seborrheic keratosis: Secondary | ICD-10-CM | POA: Diagnosis not present

## 2021-08-27 DIAGNOSIS — D225 Melanocytic nevi of trunk: Secondary | ICD-10-CM | POA: Diagnosis not present

## 2021-08-27 DIAGNOSIS — D485 Neoplasm of uncertain behavior of skin: Secondary | ICD-10-CM | POA: Diagnosis not present

## 2021-08-27 DIAGNOSIS — D2262 Melanocytic nevi of left upper limb, including shoulder: Secondary | ICD-10-CM | POA: Diagnosis not present

## 2021-08-27 DIAGNOSIS — Z85828 Personal history of other malignant neoplasm of skin: Secondary | ICD-10-CM | POA: Diagnosis not present

## 2021-08-27 DIAGNOSIS — L718 Other rosacea: Secondary | ICD-10-CM | POA: Diagnosis not present

## 2021-08-27 DIAGNOSIS — L738 Other specified follicular disorders: Secondary | ICD-10-CM | POA: Diagnosis not present

## 2021-09-22 DIAGNOSIS — E7800 Pure hypercholesterolemia, unspecified: Secondary | ICD-10-CM | POA: Diagnosis not present

## 2021-09-22 DIAGNOSIS — Z0000 Encounter for general adult medical examination without abnormal findings: Secondary | ICD-10-CM | POA: Diagnosis not present

## 2021-09-22 DIAGNOSIS — M810 Age-related osteoporosis without current pathological fracture: Secondary | ICD-10-CM | POA: Diagnosis not present

## 2021-09-28 ENCOUNTER — Other Ambulatory Visit: Payer: Self-pay | Admitting: Family Medicine

## 2021-09-28 DIAGNOSIS — M810 Age-related osteoporosis without current pathological fracture: Secondary | ICD-10-CM

## 2022-04-06 ENCOUNTER — Ambulatory Visit
Admission: RE | Admit: 2022-04-06 | Discharge: 2022-04-06 | Disposition: A | Discharge status: Home / Self Care | Payer: Medicare Other | Source: Ambulatory Visit | Attending: Family Medicine | Admitting: Family Medicine

## 2022-04-06 DIAGNOSIS — M810 Age-related osteoporosis without current pathological fracture: Secondary | ICD-10-CM | POA: Diagnosis not present

## 2022-04-06 DIAGNOSIS — Z780 Asymptomatic menopausal state: Secondary | ICD-10-CM | POA: Diagnosis not present

## 2022-05-18 DIAGNOSIS — Z961 Presence of intraocular lens: Secondary | ICD-10-CM | POA: Diagnosis not present

## 2022-05-18 DIAGNOSIS — H5213 Myopia, bilateral: Secondary | ICD-10-CM | POA: Diagnosis not present

## 2022-06-07 DIAGNOSIS — M546 Pain in thoracic spine: Secondary | ICD-10-CM | POA: Diagnosis not present

## 2022-06-28 DIAGNOSIS — M546 Pain in thoracic spine: Secondary | ICD-10-CM | POA: Diagnosis not present

## 2022-07-05 DIAGNOSIS — M546 Pain in thoracic spine: Secondary | ICD-10-CM | POA: Diagnosis not present

## 2022-07-15 NOTE — Progress Notes (Signed)
[place] [phone]   [phone]  INITIAL CONSULTATION:  Patient Care Team: Shirline Frees, MD as PCP - General (Family Medicine)  CHIEF COMPLAINTS/PURPOSE OF CONSULTATION:  Compression fractures of thoracic spine  HISTORY: 06/07/2022: Presented to [facility] due to worsening pain in the thoracic spine 06/28/2022: MRI thoracic spine: subacute moderate T9 and subacute-chronic mild anterior wedge compression fracture of T8 without retropulsion, no suspicious features. Small broad central T6-7 disc protrusion with minimal deformity of central-right hemicord without spinal canal stenosis. Small broad central-right central T7-8 protrusion with right central annular fracture without significant deformity of the cord or spinal canal stenosis. Very small left central T5-6 osteophyte complex without central zone narrowing or deformity of the cord.   HISTORY OF PRESENTING ILLNESS:  Rebecca Mcfarland 71 y.o. female with medical history significant for depression and cervical vertebral fusion. She presents to the diagnostic clinic for evaluation of recently diagnosed compression fractures. She is accompanied by her husband for this visit.   On exam today, Rebecca Mcfarland reports feeling pain in her mid/upper back for approximately 2-3 months. She rates the pain as 1-2 out of 10 on a pain scale. She noticed the pain when she was running but not at rest. She denies any radiculopathy or neuropathy. She takes a combination of advil and tylenol for pain relief. She is otherwise feeling well without any other concerning symptoms. Her energy levels are stable and she is able to complete her ADLs on her own. She has a good appetite and denies any noticeable weight loss. She denies fevers, chills, night sweats, shortness of breath, chest pain, cough, nausea, vomiting, diarrhea or constipation. She has no other complaints. Rest of the 10 point ROS is  below.    MEDICAL HISTORY:  Past Medical History:  Diagnosis Date   Cervical vertebral fusion 2003   Depression    Osteoporosis     SURGICAL HISTORY: Past Surgical History:  Procedure Laterality Date   CERVICAL DISCECTOMY     KNEE ARTHROSCOPY WITH MENISCAL REPAIR Left 09/25/2020   Procedure: KNEE ARTHROSCOPY WITH MEDIAL MENISCAL ROOT REPAIR;  Surgeon: Hiram Gash, MD;  Location: [facility];  Service: Orthopedics;  Laterality: Left;   MOHS SURGERY  2021,2011   TUBAL LIGATION      SOCIAL HISTORY: Social History   Socioeconomic History   Marital status: Married    Spouse name: Not on file   Number of children: Not on file   Years of education: Not on file   Highest education level: Not on file  Occupational History   Not on file  Tobacco Use   Smoking status: Never   Smokeless tobacco: Never  Substance and Sexual Activity   Alcohol use: Yes    Comment: occas   Drug use: Never   Sexual activity: Not on file  Other Topics Concern   Not on file  Social History Narrative   Not on file   Social Determinants of Health   Financial Resource Strain: Not on file  Food Insecurity: Not on file  Transportation Needs: Not on file  Physical Activity: Not on file  Stress: Not on file  Social Connections: Not on file  Intimate Partner Violence: Not on file    FAMILY HISTORY: Family History  Problem Relation Age of Onset   Lung cancer Mother        smoking   Lung cancer Father        smoking   Lung  cancer Maternal Grandmother        smoking   Throat cancer Maternal Grandfather        smoking   Breast cancer Neg Hx     ALLERGIES:  has No Known Allergies.  MEDICATIONS:  Current Outpatient Medications  Medication Sig Dispense Refill   buPROPion (WELLBUTRIN XL) 300 MG 24 hr tablet Take 300 mg by mouth daily.     omeprazole (PRILOSEC) 20 MG capsule Take 1 capsule (20 mg total) by mouth daily. 30 days for gastroprotection while taking NSAIDs. 30  capsule 0   sertraline (ZOLOFT) 100 MG tablet Take 200 mg by mouth daily.     No current facility-administered medications for this visit.    REVIEW OF SYSTEMS:   Constitutional: ( - ) fevers, ( - )  chills , ( - ) night sweats Eyes: ( - ) blurriness of vision, ( - ) double vision, ( - ) watery eyes Ears, nose, mouth, throat, and face: ( - ) mucositis, ( - ) sore throat Respiratory: ( - ) cough, ( - ) dyspnea, ( - ) wheezes Cardiovascular: ( - ) palpitation, ( - ) chest discomfort, ( - ) lower extremity swelling Gastrointestinal:  ( - ) nausea, ( - ) heartburn, ( - ) change in bowel habits Skin: ( - ) abnormal skin rashes Lymphatics: ( - ) new lymphadenopathy, ( - ) easy bruising Neurological: ( - ) numbness, ( - ) tingling, ( - ) new weaknesses Behavioral/Psych: ( - ) mood change, ( - ) new changes  All other systems were reviewed with the patient and are negative.  PHYSICAL EXAMINATION: ECOG PERFORMANCE STATUS: 1 - Symptomatic but completely ambulatory  Vitals:   07/16/22 1440  BP: 128/84  Pulse: 73  Resp: 16  Temp: 97.7 F (36.5 C)  SpO2: 99%   Filed Weights   07/16/22 1440  Weight: 119 lb 11.2 oz (54.3 kg)    GENERAL: well appearing female in NAD  SKIN: skin color, texture, turgor are normal, no rashes or significant lesions EYES: conjunctiva are pink and non-injected, sclera clear OROPHARYNX: no exudate, no erythema; lips, buccal mucosa, and tongue normal  NECK: supple, non-tender LYMPH:  no palpable lymphadenopathy in the cervical or supraclavicular lymph nodes.  LUNGS: clear to auscultation and percussion with normal breathing effort HEART: regular rate & rhythm and no murmurs and no lower extremity edema Musculoskeletal: no cyanosis of digits and no clubbing  PSYCH: alert & oriented x 3, fluent speech NEURO: no focal motor/sensory deficits  LABORATORY DATA:  I have reviewed the data as listed    Latest Ref Rng & Units 07/16/2022    3:53 PM  CBC  WBC 4.0 -  10.5 K/uL 10.0   Hemoglobin 12.0 - 15.0 g/dL 14.3   Hematocrit 36.0 - 46.0 % 41.0   Platelets 150 - 400 K/uL 190        Latest Ref Rng & Units 07/16/2022    3:53 PM  CMP  Glucose 70 - 99 mg/dL 83   BUN 8 - 23 mg/dL 14   Creatinine 0.44 - 1.00 mg/dL 0.72   Sodium 135 - 145 mmol/L 140   Potassium 3.5 - 5.1 mmol/L 4.0   Chloride 98 - 111 mmol/L 104   CO2 22 - 32 mmol/L 30   Calcium 8.9 - 10.3 mg/dL 10.0   Total Protein 6.5 - 8.1 g/dL 7.0   Total Bilirubin 0.3 - 1.2 mg/dL 0.4   Alkaline Phos 38 - 126  U/L 99   AST 15 - 41 U/L 16   ALT 0 - 44 U/L 10      RADIOGRAPHIC STUDIES: I have personally reviewed the radiological images as listed and agreed with the findings in the report. No results found.  ASSESSMENT & PLAN Rebecca Mcfarland is a 71 y.o. female who presents to the diagnostic clinic for recent diagnosis of compression fractures. We reviewed the MRI report in detail that did not indicate any pathologic fractures or supsicious bone lesions which is reassuring. Patient is not having any other symptoms concerning for malignancy. We will proceed with diagnostic workup to evaluate for paraproteinemia. If our workup is negative, no further workup is recommended at this time as likely cause of compression fractures is secondary to osteoporosis. Patient expressed understanding and agreed to move forward with the workup.   #Compression fractures: --Likely secondary to osteoporosis that is confirm with most recent bone density scan from 04/06/2022.  --Need to rule out paraproteinemia such as multiple myeloma as underlying cause. --Recommend to labs today to check CBC, CMP, SPEP/IFE and serum free light chains. --RTC only if above workup requires any intervention.   Orders Placed This Encounter  Procedures   CBC with Differential ([place] Only)    Standing Status:   Future    Number of Occurrences:   1    Standing Expiration Date:   07/16/2023   CMP ([place] only)     Standing Status:   Future    Number of Occurrences:   1    Standing Expiration Date:   07/16/2023   Multiple Myeloma Panel (SPEP&IFE w/QIG)    Standing Status:   Future    Number of Occurrences:   1    Standing Expiration Date:   07/15/2023   Kappa/lambda light chains    Standing Status:   Future    Number of Occurrences:   1    Standing Expiration Date:   07/15/2023    All questions were answered. The patient knows to call the clinic with any problems, questions or concerns.  I have spent a total of 45 minutes minutes of face-to-face and non-face-to-face time, preparing to see the patient, obtaining and/or reviewing separately obtained history, performing a medically appropriate examination, counseling and educating the patient, ordering tests/procedures, documenting clinical information in the electronic health record,and care coordination.   Dede Query, PA-C Department of Hematology/Oncology [place] at [facility] Phone: [phone]  Patient was seen with Dr. Lorenso Courier  I have read the above note and personally examined the patient. I agree with the assessment and plan as noted above.  Briefly Rebecca Mcfarland is a 71 year old female who presents for evaluation of spinal compression fractures.  Patient has a known history of osteoporosis which is the most likely etiology, however we have been asked to rule out multiple myeloma as a possible cause.  We will order SPEP, serum free light chains to help effectively rule this out.  In the event these tests are negative there will be no need for return to our clinic.  If she is found to have evidence of a monoclonal gammopathy we will need to pursue a bone marrow biopsy.  The patient voiced understanding of our plan moving forward.   Ledell Peoples, MD Department of Hematology/Oncology [place] at [facility] Phone: [phone] Pager: [phone] Email:  [email or web address]

## 2022-07-16 ENCOUNTER — Inpatient Hospital Stay: Payer: Medicare Other | Attending: Physician Assistant | Admitting: Physician Assistant

## 2022-07-16 ENCOUNTER — Inpatient Hospital Stay: Payer: Medicare Other

## 2022-07-16 ENCOUNTER — Encounter: Payer: Self-pay | Admitting: Physician Assistant

## 2022-07-16 VITALS — BP 128/84 | HR 73 | Temp 97.7°F | Resp 16 | Wt 119.7 lb

## 2022-07-16 DIAGNOSIS — J209 Acute bronchitis, unspecified: Secondary | ICD-10-CM | POA: Diagnosis not present

## 2022-07-16 DIAGNOSIS — Z801 Family history of malignant neoplasm of trachea, bronchus and lung: Secondary | ICD-10-CM | POA: Insufficient documentation

## 2022-07-16 DIAGNOSIS — S22000A Wedge compression fracture of unspecified thoracic vertebra, initial encounter for closed fracture: Secondary | ICD-10-CM | POA: Insufficient documentation

## 2022-07-16 LAB — CBC WITH DIFFERENTIAL (CANCER CENTER ONLY)
Abs Immature Granulocytes: 0.02 10*3/uL (ref 0.00–0.07)
Basophils Absolute: 0 10*3/uL (ref 0.0–0.1)
Basophils Relative: 0 %
Eosinophils Absolute: 0.1 10*3/uL (ref 0.0–0.5)
Eosinophils Relative: 1 %
HCT: 41 % (ref 36.0–46.0)
Hemoglobin: 14.3 g/dL (ref 12.0–15.0)
Immature Granulocytes: 0 %
Lymphocytes Relative: 15 %
Lymphs Abs: 1.5 10*3/uL (ref 0.7–4.0)
MCH: 32.9 pg (ref 26.0–34.0)
MCHC: 34.9 g/dL (ref 30.0–36.0)
MCV: 94.5 fL (ref 80.0–100.0)
Monocytes Absolute: 0.6 10*3/uL (ref 0.1–1.0)
Monocytes Relative: 6 %
Neutro Abs: 7.7 10*3/uL (ref 1.7–7.7)
Neutrophils Relative %: 78 %
Platelet Count: 190 10*3/uL (ref 150–400)
RBC: 4.34 MIL/uL (ref 3.87–5.11)
RDW: 11.5 % (ref 11.5–15.5)
WBC Count: 10 10*3/uL (ref 4.0–10.5)
nRBC: 0 % (ref 0.0–0.2)

## 2022-07-16 LAB — CMP (CANCER CENTER ONLY)
ALT: 10 U/L (ref 0–44)
AST: 16 U/L (ref 15–41)
Albumin: 4.4 g/dL (ref 3.5–5.0)
Alkaline Phosphatase: 99 U/L (ref 38–126)
Anion gap: 6 (ref 5–15)
BUN: 14 mg/dL (ref 8–23)
CO2: 30 mmol/L (ref 22–32)
Calcium: 10 mg/dL (ref 8.9–10.3)
Chloride: 104 mmol/L (ref 98–111)
Creatinine: 0.72 mg/dL (ref 0.44–1.00)
GFR, Estimated: >60 mL/min (ref >60)
Glucose, Bld: 83 mg/dL (ref 70–99)
Potassium: 4 mmol/L (ref 3.5–5.1)
Sodium: 140 mmol/L (ref 135–145)
Total Bilirubin: 0.4 mg/dL (ref 0.3–1.2)
Total Protein: 7 g/dL (ref 6.5–8.1)

## 2022-07-20 LAB — KAPPA/LAMBDA LIGHT CHAINS
Kappa free light chain: 16.3 mg/L (ref 3.3–19.4)
Kappa, lambda light chain ratio: 1.1 (ref 0.26–1.65)
Lambda free light chains: 14.8 mg/L (ref 5.7–26.3)

## 2022-07-22 LAB — MULTIPLE MYELOMA PANEL, SERUM
Albumin SerPl Elph-Mcnc: 3.8 g/dL (ref 2.9–4.4)
Albumin/Glob SerPl: 1.5 (ref 0.7–1.7)
Alpha 1: 0.2 g/dL (ref 0.0–0.4)
Alpha2 Glob SerPl Elph-Mcnc: 0.8 g/dL (ref 0.4–1.0)
B-Globulin SerPl Elph-Mcnc: 0.9 g/dL (ref 0.7–1.3)
Gamma Glob SerPl Elph-Mcnc: 0.8 g/dL (ref 0.4–1.8)
Globulin, Total: 2.7 g/dL (ref 2.2–3.9)
IgA: 198 mg/dL (ref 64–422)
IgG (Immunoglobin G), Serum: 781 mg/dL (ref 586–1602)
IgM (Immunoglobulin M), Srm: 69 mg/dL (ref 26–217)
Total Protein ELP: 6.5 g/dL (ref 6.0–8.5)

## 2022-07-27 ENCOUNTER — Telehealth: Payer: Self-pay | Admitting: Physician Assistant

## 2022-07-27 NOTE — Telephone Encounter (Signed)
I called Ms. Rebecca Mcfarland to review the lab results from 07/16/2022. There was no evidence of monoclonal protein identified which rules out paraproteinemia as underlying cause of her compression fractures. CBC and CMP were unremarkable. Dr. Lorenso Courier does not recommend any further workup at this time and her osteoporosis is the most likely etiology for the compression fractures. [name] expressed understanding of the plan provided. We will fax our results to Dr. Tonita Cong.

## 2022-08-23 DIAGNOSIS — L821 Other seborrheic keratosis: Secondary | ICD-10-CM | POA: Diagnosis not present

## 2022-08-23 DIAGNOSIS — D225 Melanocytic nevi of trunk: Secondary | ICD-10-CM | POA: Diagnosis not present

## 2022-08-23 DIAGNOSIS — L905 Scar conditions and fibrosis of skin: Secondary | ICD-10-CM | POA: Diagnosis not present

## 2022-08-23 DIAGNOSIS — L814 Other melanin hyperpigmentation: Secondary | ICD-10-CM | POA: Diagnosis not present

## 2022-08-23 DIAGNOSIS — Z85828 Personal history of other malignant neoplasm of skin: Secondary | ICD-10-CM | POA: Diagnosis not present

## 2022-08-24 DIAGNOSIS — M546 Pain in thoracic spine: Secondary | ICD-10-CM | POA: Diagnosis not present

## 2022-08-31 NOTE — Progress Notes (Signed)
72 y.o. No obstetric history on file. Married Caucasian female here for NEW GYN/osteoporosis.  Cheral Almas referred her. Just wants to discuss treatments and results. Does not want to receive gynecologic care.  Hx thoracic compression fracture of T9 in November, not oncologic in origin.  This occurred after she was running.   Patient states that Dr. Maxie Better from [facility] suggested HRT.   Patient is on Evista since November through her PCP.  She has concerns about treating with bisphosphonates and with Prolia.   Study Result  Narrative & Impression  EXAM: DUAL X-RAY ABSORPTIOMETRY (DXA) FOR BONE MINERAL DENSITY   IMPRESSION: Referring Physician:  Shirline Frees Your patient completed a bone mineral density test using GE Lunar iDXA system (analysis version: 16). Technologist: EDH PATIENT: Name: Rebecca Mcfarland, Rebecca Mcfarland Patient ID:  [id number]   Birth Date: 1951/04/01 Height:     63.5 in. Sex:         Female   Measured:   04/06/2022 Weight:     118.0 lbs. Indications: Advanced Age, Caucasian, Depression, Estrogen Deficient, History of Osteoporosis, Postmenopausal, Wellbutrin, Zoloft Fractures: None Treatments: Calcium (E943.0), Vitamin D (E933.5)   ASSESSMENT: The BMD measured at AP Spine L1-L3 is 0.771 g/cm2 with a T-score of -3.3. This patient is considered osteoporotic according to [facility]) criteria.   The quality of the exam is good. L4 was excluded due to degenerative changes.   Site Region Measured Date Measured Age YA BMD Significant CHANGE T-score AP Spine L1-L3 04/06/2022 70.9 -3.3 0.771 g/cm2 * AP Spine  L1-L3       03/02/2019    67.8         -3.0    0.809 g/cm2   DualFemur Total Right 04/06/2022    70.9         -2.6    0.685 g/cm2 DualFemur Total Right 03/02/2019    67.8         -2.3    0.717 g/cm2   DualFemur Total Mean 04/06/2022 70.9 -2.4 0.699 g/cm2 * DualFemur Total Mean  03/02/2019    67.8         -2.0    0.751 g/cm2   World Health  Organization [facility]) criteria for post-menopausal, Caucasian Women: Normal       T-score at or above -1 SD Osteopenia   T-score between -1 and -2.5 SD Osteoporosis T-score at or below -2.5 SD   RECOMMENDATION: 1. All patients should optimize calcium and vitamin D intake. 2. Consider FDA-approved medical therapies in postmenopausal women and men aged 107 years and older, based on the following: a. A hip or vertebral (clinical or morphometric) fracture. b. T-score = -2.5 at the femoral neck or spine after appropriate evaluation to exclude secondary causes. c. Low bone mass (T-score between -1.0 and -2.5 at the femoral neck or spine) and a 10-year probability of a hip fracture = 3% or a 10-year probability of a major osteoporosis-related fracture = 20% based on the [place]-adapted WHO algorithm. d. Clinician judgment and/or patient preferences may indicate treatment for people with 10-year fracture probabilities above or below these levels.   FOLLOW-UP: Patients with diagnosis of osteoporosis or at high risk for fracture should have regular bone mineral density tests. Patients eligible for Medicare are allowed routine testing every 2 years. The testing frequency can be increased to one year for patients who have rapidly progressing disease, are receiving or discontinuing medical therapy to restore bone mass, or have additional risk factors.   I  have reviewed this study and agree with the findings. [facility]     Electronically Signed   By: Ammie Ferrier M.D.   On: 04/06/2022 14:02    PCP:   Shirline Frees, MD  No LMP recorded. Patient is postmenopausal.           Sexually active: Yes.    The current method of family planning is post menopausal status.    Exercising: Yes.     Walking, core strengthening, back Smoker:  no  Health Maintenance: Pap:  09/01/17 neg, 08/15/14 neg History of abnormal Pap:  no MMG:  07/16/21 Breast Density Category B, BI-RADS CATEGORY  1 Neg Colonoscopy:  03/24/06 BMD:   04/06/22  Result  osteoporotic TDaP:  unsure Gardasil:   no   reports that she has never smoked. She has never used smokeless tobacco. She reports current alcohol use. She reports that she does not use drugs.  Past Medical History:  Diagnosis Date   Cervical vertebral fusion 2003   Depression    Osteoporosis     Past Surgical History:  Procedure Laterality Date   CERVICAL DISCECTOMY     KNEE ARTHROSCOPY WITH MENISCAL REPAIR Left 09/25/2020   Procedure: KNEE ARTHROSCOPY WITH MEDIAL MENISCAL ROOT REPAIR;  Surgeon: Hiram Gash, MD;  Location: [place];  Service: Orthopedics;  Laterality: Left;   MOHS SURGERY  2021,2011   TUBAL LIGATION      Current Outpatient Medications  Medication Sig Dispense Refill   Ascorbic Acid (VITAMIN C) 500 MG CAPS as directed Orally     buPROPion (WELLBUTRIN XL) 300 MG 24 hr tablet Take 300 mg by mouth daily.     calcium carbonate (SUPER CALCIUM) 1500 (600 Ca) MG TABS tablet 1 tablet with meals Orally Once a day     Cholecalciferol (VITAMIN D3) 20 MCG (800 UNIT) TABS 1 tablet Orally Once a day     Cyanocobalamin 1000 MCG TBCR 1 tablet Orally Once a day     EVISTA 60 MG tablet 1 tablet Orally Once a day for 30 days     sertraline (ZOLOFT) 100 MG tablet Take 200 mg by mouth daily.     No current facility-administered medications for this visit.    Family History  Problem Relation Age of Onset   Lung cancer Mother        smoking   Lung cancer Father        smoking   Lung cancer Maternal Grandmother        smoking   Throat cancer Maternal Grandfather        smoking   Breast cancer Neg Hx     Review of Systems  All other systems reviewed and are negative.   Exam:   BP 110/82 (BP Location: Left Arm, Patient Position: Sitting, Cuff Size: Normal)   Pulse 75   Ht 5' 3"$  (1.6 m)   Wt 121 lb (54.9 kg)   SpO2 95%   BMI 21.43 kg/m     General appearance: alert, cooperative and appears stated  age  Assessment:   Osteoporosis of hip and spine.  T score spine:  -3.3. Thoracic spine compression fracture. Currently on Evista.   Plan:  We discussed osteoporosis, compression fractures, and potential treatment options.  I do not recommend initiating HRT for treating osteoporosis.  This is considered preventative for osteoporosis.  Treatment can increase risk of stroke, MI, DVT, PE, and breast cancer.  We focused on Forteo and Evenity.  Written information given from [employer]. Written information from Up to Date on treating compression fractures of the spine.  She accepts referral to endocrinologist, Dr. Edmonia James, at [facility] for evaluation and treatment of osteoporosis. FU prn.   After visit summary provided.   34 min  total time was spent for this patient encounter, including preparation, face-to-face counseling with the patient, coordination of care, and documentation of the encounter.

## 2022-09-14 ENCOUNTER — Encounter: Payer: Self-pay | Admitting: Obstetrics and Gynecology

## 2022-09-14 ENCOUNTER — Telehealth: Payer: Self-pay | Admitting: Obstetrics and Gynecology

## 2022-09-14 ENCOUNTER — Ambulatory Visit: Payer: Medicare Other | Admitting: Obstetrics and Gynecology

## 2022-09-14 VITALS — BP 110/82 | HR 75 | Ht 63.0 in | Wt 121.0 lb

## 2022-09-14 DIAGNOSIS — M8000XD Age-related osteoporosis with current pathological fracture, unspecified site, subsequent encounter for fracture with routine healing: Secondary | ICD-10-CM | POA: Diagnosis not present

## 2022-09-14 NOTE — Patient Instructions (Signed)
Romosozumab Injection What is this medication? ROMOSOZUMAB (roe moe SOZ ue mab) prevents and treats osteoporosis. It works by Paramedic stronger and less likely to break (fracture). It is a monoclonal antibody. This medicine may be used for other purposes; ask your health care provider or pharmacist if you have questions. COMMON BRAND NAME(S): EVENITY What should I tell my care team before I take this medication? They need to know if you have any of these conditions: Dental disease Heart attack Heart disease Kidney problems Low levels of calcium in the blood On dialysis Stroke Wear dentures An unusual or allergic reaction to romosozumab, other medications, foods, dyes or preservatives Pregnant or trying to get pregnant Breast-feeding How should I use this medication? This medication is injected under the skin. It is given by your care team in a hospital or clinic setting. A special MedGuide will be given to you by the pharmacist with each prescription and refill. Be sure to read this information carefully each time. Talk to your care team about the use of this medication in children. Special care may be needed. Overdosage: If you think you have taken too much of this medicine contact a poison control center or emergency room at once. NOTE: This medicine is only for you. Do not share this medicine with others. What if I miss a dose? Keep appointments for follow-up doses. It is important not to miss your dose. Call your care team if you are unable to keep an appointment. What may interact with this medication? Interactions are not expected. This list may not describe all possible interactions. Give your health care provider a list of all the medicines, herbs, non-prescription drugs, or dietary supplements you use. Also tell them if you smoke, drink alcohol, or use illegal drugs. Some items may interact with your medicine. What should I watch for while using this medication? Your  condition will be monitored carefully while you are receiving this medication. You may need bloodwork while taking this medication. You should make sure you get enough calcium and vitamin D while you are taking this medication. Discuss the foods you eat and the vitamins you take with your care team. Some people who take this medication have severe bone, joint, or muscle pain. This medication may also increase your risk for jaw problems or a broken thigh bone. Tell your care team right away if you have severe pain in your jaw, bones, joints, or muscles. Tell you care team if you have any pain that does not go away or that gets worse. Tell your dentist and dental surgeon that you are taking this medication. You should not have major dental surgery while on this medication. See your dentist to have a dental exam and fix any dental problems before starting this medication. Take good care of your teeth while on this medication. Make sure you see your dentist for regular follow-up appointments. What side effects may I notice from receiving this medication? Side effects that you should report to your care team as soon as possible: Allergic reactions or angioedema--skin rash, itching or hives, swelling of the face, eyes, lips, tongue, arms, or legs, trouble swallowing or breathing Heart attack--pain or tightness in the chest, shoulders, arms, or jaw, nausea, shortness of breath, cold or clammy skin, feeling faint or lightheaded Low calcium level--muscle pain or cramps, confusion, tingling, or numbness in the hands or feet Osteonecrosis of the jaw--pain, swelling, or redness in the mouth, numbness of the jaw, poor healing after dental work,  unusual discharge from the mouth, visible bones in the mouth Severe bone, joint, or muscle pain Stroke--sudden numbness or weakness of the face, arm, or leg, trouble speaking, confusion, trouble walking, loss of balance or coordination, dizziness, severe headache, change in  vision Side effects that usually do not require medical attention (report to your care team if they continue or are bothersome): Headache Joint pain Muscle spasms Pain, redness, or irritation at injection site Swelling of the ankles, hands, or feet This list may not describe all possible side effects. Call your doctor for medical advice about side effects. You may report side effects to FDA at 1-800-FDA-1088. Where should I keep my medication? This medication is given in a hospital or clinic. It will not be stored at home. NOTE: This sheet is a summary. It may not cover all possible information. If you have questions about this medicine, talk to your doctor, pharmacist, or health care provider.  2023 Elsevier/Gold Standard (2021-08-19 00:00:00)   Teriparatide Injection What is this medication? TERIPARATIDE (terr ih PAR a tyd) treats osteoporosis. It works by Paramedic stronger and less likely to break (fracture). This medicine may be used for other purposes; ask your health care provider or pharmacist if you have questions. COMMON BRAND NAME(S): FORTEO What should I tell my care team before I take this medication? They need to know if you have any of these conditions: Bone disease other than osteoporosis High levels of calcium in the blood History of cancer in the bone Kidney stone Paget's disease Parathyroid disease Receiving radiation therapy An unusual or allergic reaction to teriparatide, other medications, foods, dyes, or preservatives Pregnant or trying to get pregnant Breast-feeding How should I use this medication? This medication is injected under the skin. You will be taught how to prepare and give it. Take it as directed on the prescription label at the same time every day. Keep taking it unless your care team tells you to stop. This medication comes with INSTRUCTIONS FOR USE. Ask your pharmacist for directions on how to use this medication. Read the information  carefully. Talk to your pharmacist or care team if you have questions. It is important that you put your used needles and pens in a special sharps container. Do not put them in a trash can. If you do not have a sharps container, call your pharmacist or care team to get one. A special MedGuide will be given to you by the pharmacist with each prescription and refill. Be sure to read this information carefully each time. Talk to your care team about the use of this medication in children. Special care may be needed. Overdosage: If you think you have taken too much of this medicine contact a poison control center or emergency room at once. NOTE: This medicine is only for you. Do not share this medicine with others. What if I miss a dose? If you miss a dose, take it as soon as you can. If it is almost time for your next dose, take only that dose. Do not take double or extra doses. What may interact with this medication? Digoxin This list may not describe all possible interactions. Give your health care provider a list of all the medicines, herbs, non-prescription drugs, or dietary supplements you use. Also tell them if you smoke, drink alcohol, or use illegal drugs. Some items may interact with your medicine. What should I watch for while using this medication? Visit your care team for regular checks on your progress.  You may need blood work while you are taking this medication. You should make sure you get enough calcium and vitamin D while you are taking this medication. Discuss the foods you eat and the vitamins you take with your care team. This medication may affect your coordination, reaction time, or judgment. Do not drive or operate machinery until you know how this medication affects you. Sit up or stand slowly to reduce the risk of dizzy or fainting spells. Drinking alcohol with this medication can increase the risk of these side effects. Talk to your care team about your risk of cancer. You may  be more at risk for certain types of cancers if you take this medication. What side effects may I notice from receiving this medication? Side effects that you should report to your care team as soon as possible: Allergic reactions--skin rash, itching, hives, swelling of the face, lips, tongue, or throat High calcium level--increased thirst or amount of urine, nausea, vomiting, confusion, unusual weakness or fatigue, bone pain Kidney stones--blood in the urine, pain or trouble passing urine, pain in the lower back or sides Low blood pressure--dizziness, feeling faint or lightheaded, blurry vision Side effects that usually do not require medical attention (report these to your care team if they continue or are bothersome): Dizziness Headache Joint pain Nausea Pain, redness, or irritation at injection site This list may not describe all possible side effects. Call your doctor for medical advice about side effects. You may report side effects to FDA at 1-800-FDA-1088. Where should I keep my medication? Keep out of the reach of children and pets. Store the pens in the refrigerator. Do not freeze. Use the pen quickly after taking out of the refrigerator and recap and return to refrigerator right after using. Protect from light. Get rid of any unused medication 28 days after the first injection from the pen. Get rid of any unopened, unused medication after the expiration date on the label. To get rid of medications that are no longer needed or have expired: Take the medication to a medication take-back program. Check with your pharmacy or law enforcement to find a location. If you cannot return the medication, ask your pharmacist or care team how to get rid of this medication safely. NOTE: This sheet is a summary. It may not cover all possible information. If you have questions about this medicine, talk to your doctor, pharmacist, or health care provider.  2023 Elsevier/Gold Standard (2007-09-09  00:00:00)

## 2022-09-14 NOTE — Telephone Encounter (Signed)
Routed to [facility] to schedule referral appointment.

## 2022-09-14 NOTE — Telephone Encounter (Signed)
Please assist with referral to Dr. Gale Journey at [facility] endocrinology for my patient who has osteoporosis and thoracic compression fracture.   I have placed an order already.

## 2022-09-20 ENCOUNTER — Encounter: Payer: Self-pay | Admitting: Obstetrics and Gynecology

## 2022-09-24 ENCOUNTER — Other Ambulatory Visit: Payer: Self-pay | Admitting: Family Medicine

## 2022-09-24 DIAGNOSIS — Z1231 Encounter for screening mammogram for malignant neoplasm of breast: Secondary | ICD-10-CM

## 2022-09-27 ENCOUNTER — Ambulatory Visit
Admission: RE | Admit: 2022-09-27 | Discharge: 2022-09-27 | Disposition: A | Discharge status: Home / Self Care | Payer: Medicare Other | Source: Ambulatory Visit | Attending: Family Medicine | Admitting: Family Medicine

## 2022-09-27 DIAGNOSIS — Z1231 Encounter for screening mammogram for malignant neoplasm of breast: Secondary | ICD-10-CM

## 2022-10-01 NOTE — Telephone Encounter (Signed)
Patient is scheduled for 10/27/22.

## 2022-10-12 DIAGNOSIS — E7800 Pure hypercholesterolemia, unspecified: Secondary | ICD-10-CM | POA: Diagnosis not present

## 2022-10-12 DIAGNOSIS — M810 Age-related osteoporosis without current pathological fracture: Secondary | ICD-10-CM | POA: Diagnosis not present

## 2022-10-12 DIAGNOSIS — Z0000 Encounter for general adult medical examination without abnormal findings: Secondary | ICD-10-CM | POA: Diagnosis not present

## 2022-10-27 DIAGNOSIS — M810 Age-related osteoporosis without current pathological fracture: Secondary | ICD-10-CM | POA: Diagnosis not present

## 2022-11-01 DIAGNOSIS — M810 Age-related osteoporosis without current pathological fracture: Secondary | ICD-10-CM | POA: Diagnosis not present

## 2022-12-10 ENCOUNTER — Ambulatory Visit
Admission: RE | Admit: 2022-12-10 | Discharge: 2022-12-10 | Disposition: A | Discharge status: Home / Self Care | Payer: Medicare Other | Source: Ambulatory Visit | Attending: Medical | Admitting: Medical

## 2022-12-10 ENCOUNTER — Other Ambulatory Visit: Payer: Self-pay | Admitting: Medical

## 2022-12-10 DIAGNOSIS — S8001XA Contusion of right knee, initial encounter: Secondary | ICD-10-CM | POA: Diagnosis not present

## 2022-12-10 DIAGNOSIS — M25561 Pain in right knee: Secondary | ICD-10-CM

## 2022-12-13 DIAGNOSIS — Z09 Encounter for follow-up examination after completed treatment for conditions other than malignant neoplasm: Secondary | ICD-10-CM | POA: Diagnosis not present

## 2022-12-13 DIAGNOSIS — K649 Unspecified hemorrhoids: Secondary | ICD-10-CM | POA: Diagnosis not present

## 2022-12-13 DIAGNOSIS — K5730 Diverticulosis of large intestine without perforation or abscess without bleeding: Secondary | ICD-10-CM | POA: Diagnosis not present

## 2022-12-13 DIAGNOSIS — Z86010 Personal history of colonic polyps: Secondary | ICD-10-CM | POA: Diagnosis not present

## 2022-12-13 DIAGNOSIS — D123 Benign neoplasm of transverse colon: Secondary | ICD-10-CM | POA: Diagnosis not present

## 2022-12-13 DIAGNOSIS — D124 Benign neoplasm of descending colon: Secondary | ICD-10-CM | POA: Diagnosis not present

## 2022-12-15 DIAGNOSIS — D123 Benign neoplasm of transverse colon: Secondary | ICD-10-CM | POA: Diagnosis not present

## 2022-12-15 DIAGNOSIS — D124 Benign neoplasm of descending colon: Secondary | ICD-10-CM | POA: Diagnosis not present

## 2023-01-05 DIAGNOSIS — M810 Age-related osteoporosis without current pathological fracture: Secondary | ICD-10-CM | POA: Diagnosis not present

## 2023-04-09 IMAGING — MG MM DIGITAL SCREENING BILAT W/ TOMO AND CAD
8 series · 9 of 24 positions shown · non-contrast
Comparison: Previous exam(s).

CLINICAL DATA: Screening.

EXAM:
DIGITAL SCREENING BILATERAL MAMMOGRAM WITH TOMOSYNTHESIS AND CAD
TECHNIQUE: Bilateral screening digital craniocaudal and mediolateral oblique
mammograms were obtained. Bilateral screening digital breast
tomosynthesis was performed. The images were evaluated with
computer-aided detection.

[R MLO synth-2D]
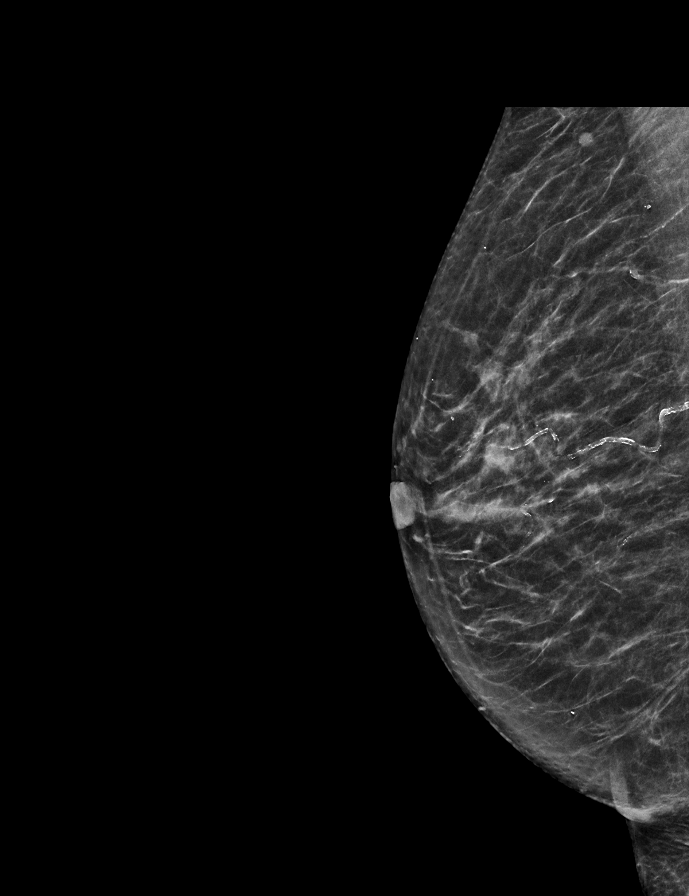

[L MLO synth-2D]
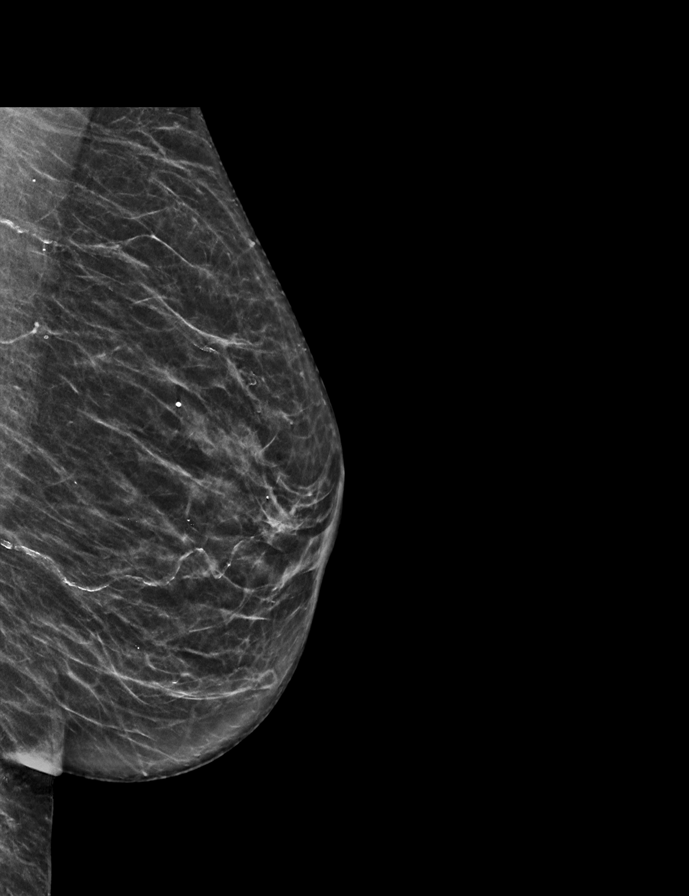

[L CC synth-2D]
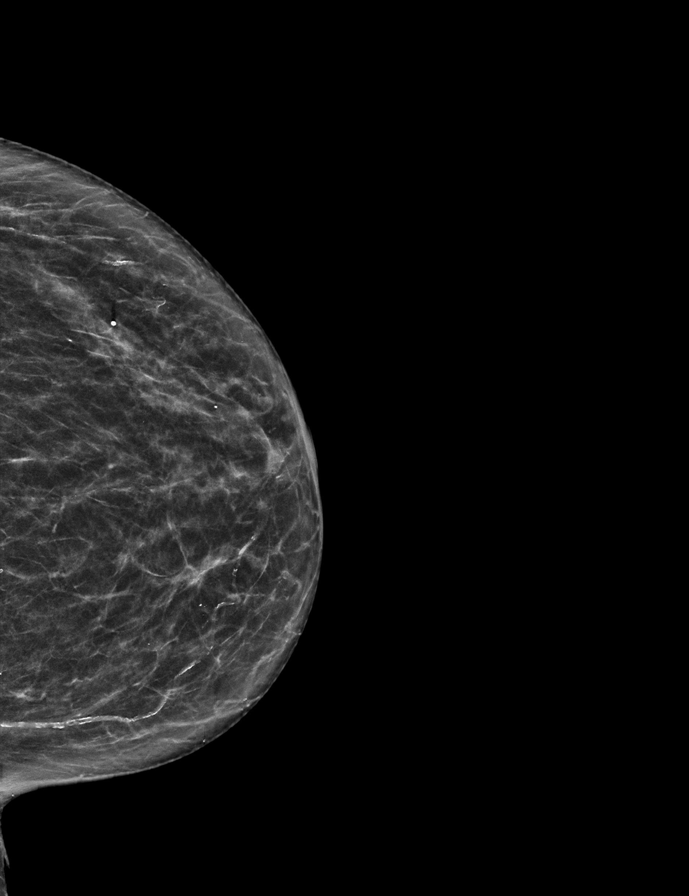

[R CC synth-2D]
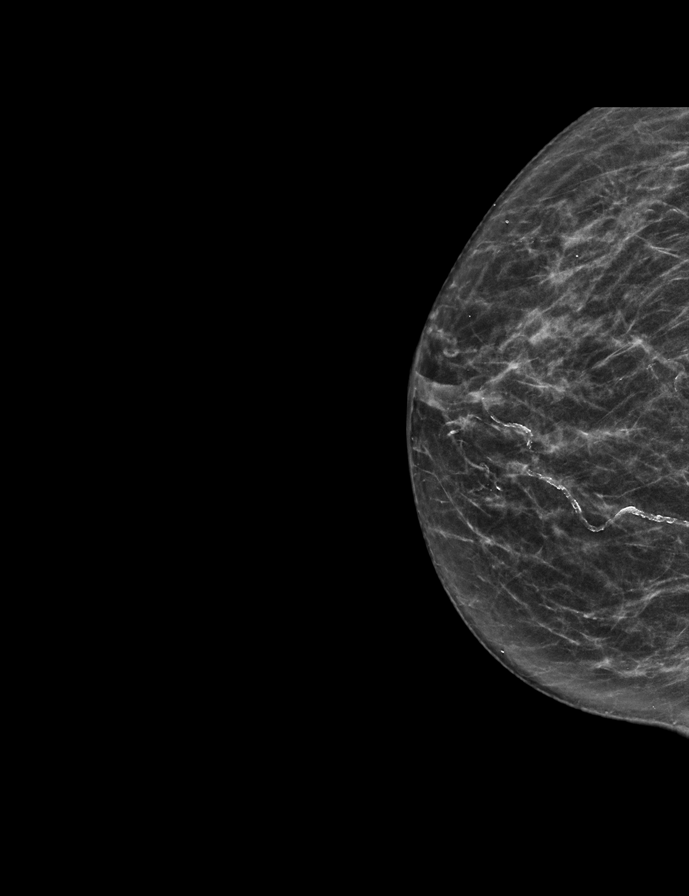

[R CC tomo · 2 of 53 frames shown]
[frame 18/53]
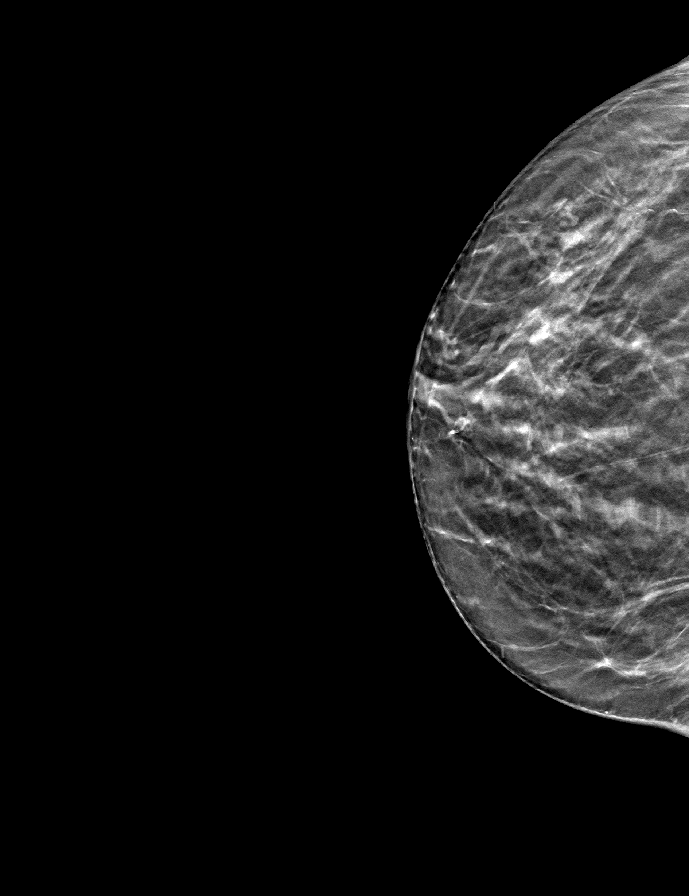
[frame 27/53]
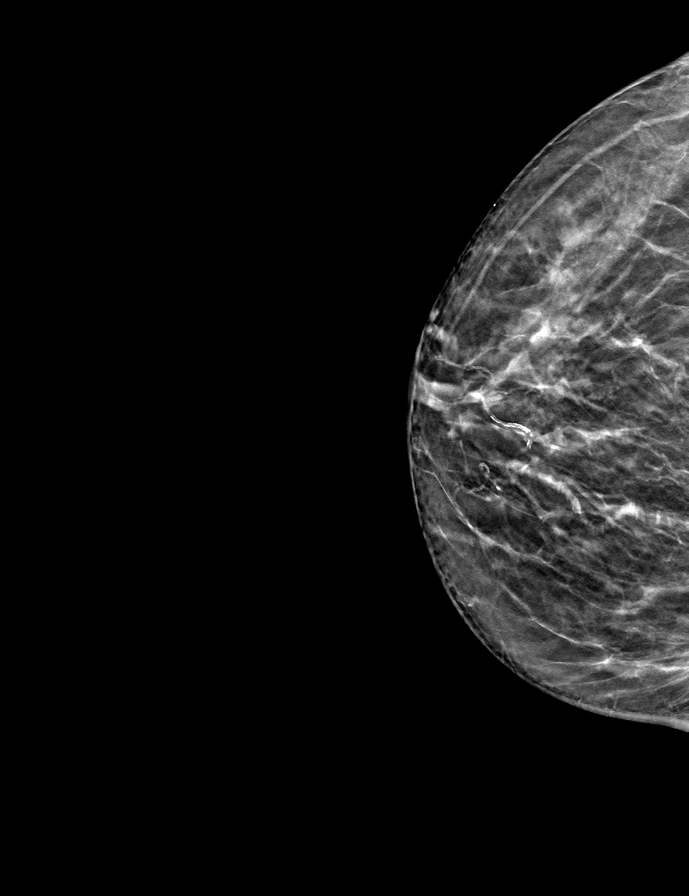

[R MLO tomo · tomo slice 27/54.0]
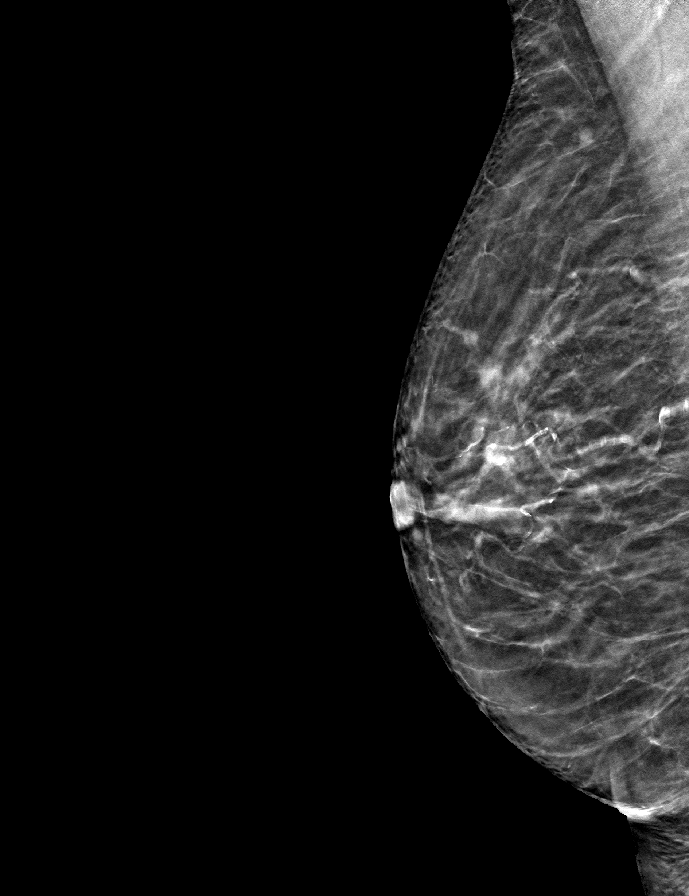

[L CC tomo · tomo slice 27/54.0]
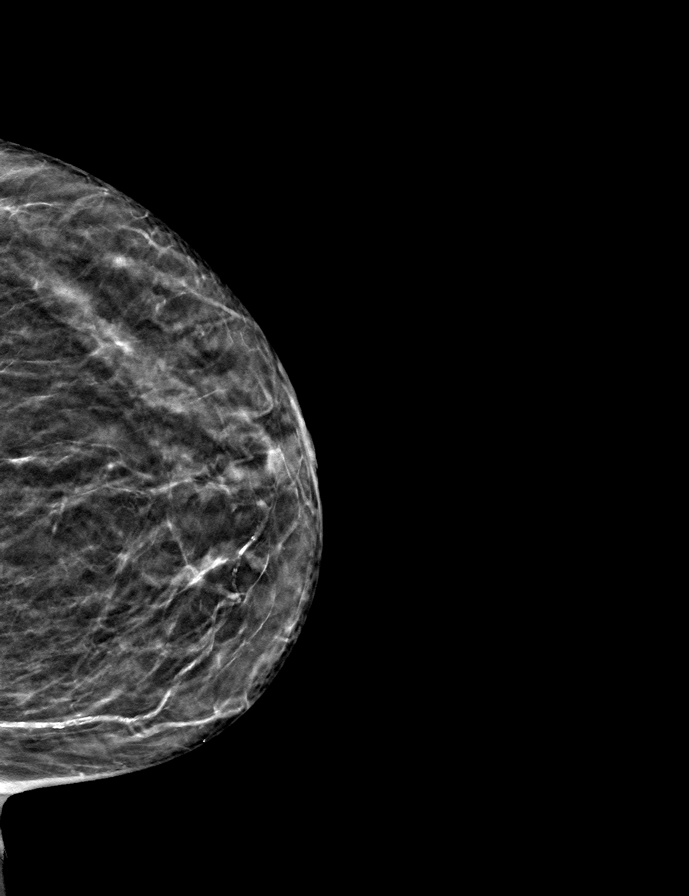

[L MLO tomo · tomo slice 28/55.0]
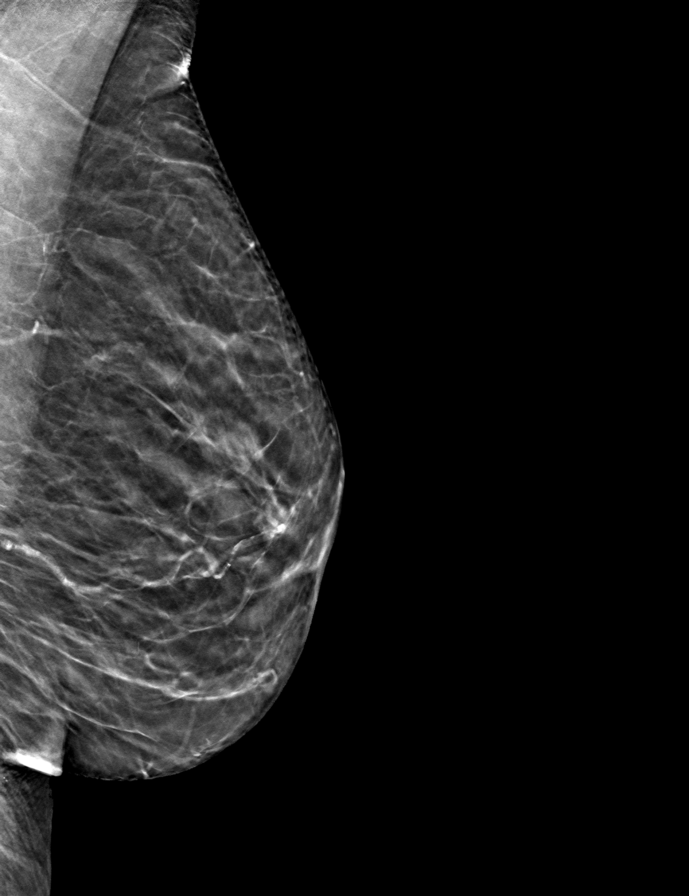

[9 of 24 positions shown; findings below may reference images not displayed]

ACR Breast Density Category b: There are scattered areas of
fibroglandular density.
FINDINGS: There are no findings suspicious for malignancy.
IMPRESSION: No mammographic evidence of malignancy. A result letter of this
screening mammogram will be mailed directly to the patient.

RECOMMENDATION:
Screening mammogram in one year. (Code:51-O-LD2)

BI-RADS CATEGORY  1: Negative.

## 2023-05-09 ENCOUNTER — Other Ambulatory Visit: Payer: Self-pay | Admitting: Endocrinology

## 2023-05-09 DIAGNOSIS — M810 Age-related osteoporosis without current pathological fracture: Secondary | ICD-10-CM

## 2023-08-25 DIAGNOSIS — L821 Other seborrheic keratosis: Secondary | ICD-10-CM | POA: Diagnosis not present

## 2023-08-25 DIAGNOSIS — Z85828 Personal history of other malignant neoplasm of skin: Secondary | ICD-10-CM | POA: Diagnosis not present

## 2023-08-25 DIAGNOSIS — D225 Melanocytic nevi of trunk: Secondary | ICD-10-CM | POA: Diagnosis not present

## 2023-08-25 DIAGNOSIS — L905 Scar conditions and fibrosis of skin: Secondary | ICD-10-CM | POA: Diagnosis not present

## 2023-10-27 DIAGNOSIS — E7800 Pure hypercholesterolemia, unspecified: Secondary | ICD-10-CM | POA: Diagnosis not present

## 2023-10-27 DIAGNOSIS — Z0000 Encounter for general adult medical examination without abnormal findings: Secondary | ICD-10-CM | POA: Diagnosis not present

## 2023-10-27 DIAGNOSIS — M810 Age-related osteoporosis without current pathological fracture: Secondary | ICD-10-CM | POA: Diagnosis not present

## 2024-01-04 ENCOUNTER — Other Ambulatory Visit: Payer: Self-pay | Admitting: Family Medicine

## 2024-01-04 DIAGNOSIS — M810 Age-related osteoporosis without current pathological fracture: Secondary | ICD-10-CM

## 2024-01-11 ENCOUNTER — Ambulatory Visit

## 2024-01-11 DIAGNOSIS — M810 Age-related osteoporosis without current pathological fracture: Secondary | ICD-10-CM | POA: Diagnosis not present

## 2024-01-11 DIAGNOSIS — Z13820 Encounter for screening for osteoporosis: Secondary | ICD-10-CM | POA: Diagnosis not present

## 2024-01-11 DIAGNOSIS — Z780 Asymptomatic menopausal state: Secondary | ICD-10-CM | POA: Diagnosis not present

## 2024-01-13 ENCOUNTER — Other Ambulatory Visit: Payer: Medicare Other

## 2024-01-18 DIAGNOSIS — M810 Age-related osteoporosis without current pathological fracture: Secondary | ICD-10-CM | POA: Diagnosis not present

## 2024-03-06 ENCOUNTER — Other Ambulatory Visit: Payer: Self-pay | Admitting: Family Medicine

## 2024-03-06 DIAGNOSIS — Z1231 Encounter for screening mammogram for malignant neoplasm of breast: Secondary | ICD-10-CM

## 2024-03-15 ENCOUNTER — Ambulatory Visit
Admission: RE | Admit: 2024-03-15 | Discharge: 2024-03-15 | Disposition: A | Discharge status: Home / Self Care | Source: Ambulatory Visit

## 2024-03-15 DIAGNOSIS — Z1231 Encounter for screening mammogram for malignant neoplasm of breast: Secondary | ICD-10-CM | POA: Diagnosis not present

## 2024-04-26 DIAGNOSIS — M810 Age-related osteoporosis without current pathological fracture: Secondary | ICD-10-CM | POA: Diagnosis not present

## 2024-06-21 ENCOUNTER — Encounter: Payer: Self-pay | Admitting: Oncology

## 2024-08-12 ENCOUNTER — Emergency Department (HOSPITAL_BASED_OUTPATIENT_CLINIC_OR_DEPARTMENT_OTHER): Admitting: Radiology

## 2024-08-12 ENCOUNTER — Emergency Department (HOSPITAL_BASED_OUTPATIENT_CLINIC_OR_DEPARTMENT_OTHER)
Admission: EM | Admit: 2024-08-12 | Discharge: 2024-08-12 | Disposition: A | Discharge status: Home / Self Care | Attending: Emergency Medicine | Admitting: Emergency Medicine

## 2024-08-12 ENCOUNTER — Encounter (HOSPITAL_BASED_OUTPATIENT_CLINIC_OR_DEPARTMENT_OTHER): Payer: Self-pay | Admitting: *Deleted

## 2024-08-12 ENCOUNTER — Other Ambulatory Visit: Payer: Self-pay

## 2024-08-12 DIAGNOSIS — W010XXA Fall on same level from slipping, tripping and stumbling without subsequent striking against object, initial encounter: Secondary | ICD-10-CM | POA: Diagnosis not present

## 2024-08-12 DIAGNOSIS — S6992XA Unspecified injury of left wrist, hand and finger(s), initial encounter: Secondary | ICD-10-CM | POA: Diagnosis present

## 2024-08-12 DIAGNOSIS — M25532 Pain in left wrist: Secondary | ICD-10-CM | POA: Diagnosis present

## 2024-08-12 DIAGNOSIS — S52615A Nondisplaced fracture of left ulna styloid process, initial encounter for closed fracture: Secondary | ICD-10-CM | POA: Insufficient documentation

## 2024-08-12 DIAGNOSIS — M810 Age-related osteoporosis without current pathological fracture: Secondary | ICD-10-CM | POA: Diagnosis not present

## 2024-08-12 DIAGNOSIS — S52502A Unspecified fracture of the lower end of left radius, initial encounter for closed fracture: Secondary | ICD-10-CM | POA: Insufficient documentation

## 2024-08-12 MED ORDER — HYDROCODONE-ACETAMINOPHEN 5-325 MG PO TABS: 1.0000 | ORAL_TABLET | Freq: Four times a day (QID) | ORAL | 0 refills | Status: AC | PRN

## 2024-08-12 MED ORDER — OXYCODONE-ACETAMINOPHEN 5-325 MG PO TABS
1.0000 | ORAL_TABLET | Freq: Once | ORAL | Status: AC
  Administered 2024-08-12: 1 via ORAL
  Filled 2024-08-12: qty 1

## 2024-08-12 NOTE — ED Provider Notes (Signed)
 I provided a substantive portion of the care of this patient.  I personally made/approved the management plan for this patient and take responsibility for the patient management.      74 year old female presents after injury to her left wrist after mechanical fall.  Has evidence of fracture.  Placed in splint and discharged   Dasie Faden, MD 08/12/24 1601

## 2024-08-12 NOTE — ED Provider Notes (Signed)
 " [facility] AT [facility] Provider Note   CSN: [id number] Arrival date & time: 08/12/24  1530     Patient presents with: Wrist Pain   Rebecca Mcfarland is a 74 y.o. female with past medical history significant for osteoporosis who presents with concern for trip and fall injury with deformity and pain of left wrist.  She denies any other areas of tenderness.  She denies hitting her head, loss of consciousness.  She took some naproxen prior to arrival reports that if her wrist is still her pain is overall under control but it is throbbing.  No loss of sensation of the hand distal to the injury.    Wrist Pain       Prior to Admission medications  Medication Sig Start Date End Date Taking? Authorizing Provider  HYDROcodone -acetaminophen  (NORCO/VICODIN) 5-325 MG tablet Take 1 tablet by mouth every 6 (six) hours as needed. 08/12/24  Yes Alquan Morrish H, PA-C  Ascorbic Acid (VITAMIN C) 500 MG CAPS as directed Orally    [provider]  buPROPion (WELLBUTRIN XL) 300 MG 24 hr tablet Take 300 mg by mouth daily.    [provider]  calcium carbonate (SUPER CALCIUM) 1500 (600 Ca) MG TABS tablet 1 tablet with meals Orally Once a day    [provider]  Cholecalciferol (VITAMIN D3) 20 MCG (800 UNIT) TABS 1 tablet Orally Once a day    [provider]  Cyanocobalamin 1000 MCG TBCR 1 tablet Orally Once a day    [provider]  EVISTA 60 MG tablet 1 tablet Orally Once a day for 30 days 06/14/22   [provider]  sertraline (ZOLOFT) 100 MG tablet Take 200 mg by mouth daily.    [provider]    Allergies: Patient has no known allergies.    Review of Systems  All other systems reviewed and are negative.   Updated Vital Signs There were no vitals taken for this visit.  Physical Exam Vitals and nursing note reviewed.  Constitutional:      General: She is not in acute distress.     Appearance: Normal appearance.  HENT:     Head: Normocephalic and atraumatic.  Eyes:     General:        Right eye: No discharge.        Left eye: No discharge.  Cardiovascular:     Rate and Rhythm: Normal rate and regular rhythm.     Pulses: Normal pulses.     Comments: Radial, ulnar pulse 2+ in the affected left upper extremity Pulmonary:     Effort: Pulmonary effort is normal. No respiratory distress.  Musculoskeletal:        General: Swelling and deformity present.     Comments: Soft tissue swelling, deformity noted to left wrist with slight step off  Skin:    General: Skin is warm and dry.     Capillary Refill: Capillary refill takes less than 2 seconds.  Neurological:     Mental Status: She is alert and oriented to person, place, and time.  Psychiatric:        Mood and Affect: Mood normal.        Behavior: Behavior normal.     (all labs ordered are listed, but only abnormal results are displayed) Labs Reviewed - No data to display  EKG: None  Radiology: DG Wrist Complete Left Result Date: 08/12/2024 CLINICAL DATA:  Status post fall. EXAM: LEFT WRIST - COMPLETE  3+ VIEW COMPARISON:  None Available. FINDINGS: Acute fracture deformities are seen involving the distal left radius and left ulnar styloid. There is no evidence of dislocation. A fracture fragment of indeterminate age is seen along the dorsal aspect of the left wrist. Degenerative changes are noted. There is moderately severe diffuse soft tissue swelling. IMPRESSION: 1. Acute fractures of the distal left radius and left ulnar styloid. 2. Fracture fragment of indeterminate age along the dorsal aspect of the left wrist. Electronically Signed   By: Suzen Dials M.D.   On: 08/12/2024 16:18     Procedures   Medications Ordered in the ED  oxyCODONE -acetaminophen  (PERCOCET/ROXICET) 5-325 MG per tablet 1 tablet (1 tablet Oral Given 08/12/24 1600)                                    Medical Decision  Making Amount and/or Complexity of Data Reviewed Radiology: ordered.   This patient is a 74 y.o. female  who presents to the ED for concern of wrist injury.   Differential diagnoses prior to evaluation: The emergent differential diagnosis includes, but is not limited to, fracture, dislocation versus other. This is not an exhaustive differential.   Past Medical History / Co-morbidities / Social History: Osteoporosis  Physical Exam: Physical exam performed. The pertinent findings include:   General: Swelling and deformity present.     Comments: Soft tissue swelling, deformity noted to left wrist with slight step off   Radial, ulnar pulses are 2+ in the affected left upper extremity.  Lab Tests/Imaging studies: I personally interpreted labs/imaging and the pertinent results include: Plain film radiographs of the left wrist shows distal radius fracture, ulnar styloid fracture, minimal displacement. I agree with the radiologist interpretation.   Medications: Percocet for pain.  Volar splint applied.  Encourage close follow-up with hand surgery.  Disposition: After consideration of the diagnostic results and the patients response to treatment, I feel that splint in place and appears appropriate, intact capillary refill, distal sensation.  Encouraged hand surgery follow-up, patient discharged in stable condition, short course of pain medication provided.SABRA   emergency department workup does not suggest an emergent condition requiring admission or immediate intervention beyond what has been performed at this time. The plan is: as above. The patient is safe for discharge and has been instructed to return immediately for worsening symptoms, change in symptoms or any other concerns.   Final diagnoses:  Closed fracture of distal end of left radius, unspecified fracture morphology, initial encounter  Closed nondisplaced fracture of styloid process of left ulna, initial encounter    ED Discharge  Orders          Ordered    HYDROcodone -acetaminophen  (NORCO/VICODIN) 5-325 MG tablet  Every 6 hours PRN        08/12/24 1628               Cordarius Benning, Sherlean DEL, PA-C 08/12/24 1629  "

## 2024-08-12 NOTE — Discharge Instructions (Addendum)
 Please use Tylenol or ibuprofen for pain.  You may use 600 mg ibuprofen every 6 hours or 1000 mg of Tylenol every 6 hours.  You may choose to alternate between the 2.  This would be most effective.  Not to exceed 4 g of Tylenol within 24 hours.  Not to exceed 3200 mg ibuprofen 24 hours.  You can use the stronger narcotic pain medication in place of Tylenol for severe break through pain.  If you take the narcotic pain medication that we prescribed recommend that you also take a laxative such as MiraLAX or Dulcolax every day that you take the narcotic pain medicine, and drink plenty of fluids, 50 to 64 ounces to prevent any constipation.

## 2024-08-12 NOTE — ED Triage Notes (Signed)
 Pt tripped and fell and has pain and deformity of left wrist.
